# Patient Record
Sex: Female | Born: 1980 | Race: White | Hispanic: Yes | Marital: Single | Smoking: Never smoker
Health system: Southern US, Community
[De-identification: ages and names within clinical notes are randomized; demographics above are authoritative.]

## PROBLEM LIST (undated history)

## (undated) DIAGNOSIS — Z789 Other specified health status: Secondary | ICD-10-CM

## (undated) HISTORY — DX: Other specified health status: Z78.9

## (undated) HISTORY — PX: NO PAST SURGERIES: SHX2092

---

## 2008-07-18 ENCOUNTER — Inpatient Hospital Stay (HOSPITAL_COMMUNITY): Admission: AD | Admit: 2008-07-18 | Discharge: 2008-07-18 | Payer: Self-pay | Admitting: Obstetrics & Gynecology

## 2008-07-18 ENCOUNTER — Ambulatory Visit: Payer: Self-pay | Admitting: Advanced Practice Midwife

## 2008-09-05 ENCOUNTER — Ambulatory Visit (HOSPITAL_COMMUNITY): Admission: RE | Admit: 2008-09-05 | Discharge: 2008-09-05 | Payer: Self-pay | Admitting: Obstetrics & Gynecology

## 2008-09-27 ENCOUNTER — Ambulatory Visit (HOSPITAL_COMMUNITY): Admission: RE | Admit: 2008-09-27 | Discharge: 2008-09-27 | Payer: Self-pay | Admitting: Obstetrics & Gynecology

## 2008-10-11 ENCOUNTER — Ambulatory Visit (HOSPITAL_COMMUNITY): Admission: RE | Admit: 2008-10-11 | Discharge: 2008-10-11 | Payer: Self-pay | Admitting: Obstetrics & Gynecology

## 2008-11-17 ENCOUNTER — Ambulatory Visit: Payer: Self-pay | Admitting: Obstetrics & Gynecology

## 2008-11-21 ENCOUNTER — Inpatient Hospital Stay (HOSPITAL_COMMUNITY): Admission: RE | Admit: 2008-11-21 | Discharge: 2008-11-23 | Payer: Self-pay | Admitting: Obstetrics and Gynecology

## 2008-11-21 ENCOUNTER — Ambulatory Visit: Payer: Self-pay | Admitting: Family Medicine

## 2010-05-17 LAB — CBC
MCHC: 33.1 g/dL (ref 30.0–36.0)
Platelets: 205 10*3/uL (ref 150–400)
RDW: 14.2 % (ref 11.5–15.5)
RDW: 14.4 % (ref 11.5–15.5)

## 2010-05-17 LAB — RUBELLA SCREEN: Rubella: >500 IU/mL — ABNORMAL HIGH

## 2010-05-17 LAB — RPR: RPR Ser Ql: NONREACTIVE

## 2010-05-17 LAB — DIFFERENTIAL
Basophils Absolute: 0 10*3/uL (ref 0.0–0.1)
Basophils Relative: 0 % (ref 0–1)
Eosinophils Relative: 1 % (ref 0–5)
Lymphocytes Relative: 21 % (ref 12–46)
Neutro Abs: 5.7 10*3/uL (ref 1.7–7.7)

## 2010-05-17 LAB — ANA: Anti Nuclear Antibody(ANA): NEGATIVE

## 2010-05-17 LAB — HEPATITIS B SURFACE ANTIGEN: Hepatitis B Surface Ag: NEGATIVE

## 2010-05-17 LAB — TYPE AND SCREEN: ABO/RH(D): A POS

## 2010-05-21 LAB — URINALYSIS, ROUTINE W REFLEX MICROSCOPIC
Glucose, UA: NEGATIVE mg/dL
Ketones, ur: NEGATIVE mg/dL
Specific Gravity, Urine: 1.015 (ref 1.005–1.030)
pH: 7.5 (ref 5.0–8.0)

## 2010-05-21 LAB — WET PREP, GENITAL
Clue Cells Wet Prep HPF POC: NONE SEEN
Trich, Wet Prep: NONE SEEN

## 2018-08-29 ENCOUNTER — Inpatient Hospital Stay (HOSPITAL_COMMUNITY)
Admission: EM | Admit: 2018-08-29 | Discharge: 2018-08-29 | Disposition: A | Discharge status: Home / Self Care | Payer: Self-pay | Attending: Obstetrics and Gynecology | Admitting: Obstetrics and Gynecology

## 2018-08-29 ENCOUNTER — Inpatient Hospital Stay (HOSPITAL_COMMUNITY): Payer: Self-pay

## 2018-08-29 ENCOUNTER — Encounter (HOSPITAL_COMMUNITY): Payer: Self-pay | Admitting: *Deleted

## 2018-08-29 ENCOUNTER — Other Ambulatory Visit: Payer: Self-pay

## 2018-08-29 DIAGNOSIS — O26891 Other specified pregnancy related conditions, first trimester: Secondary | ICD-10-CM

## 2018-08-29 DIAGNOSIS — Z833 Family history of diabetes mellitus: Secondary | ICD-10-CM | POA: Insufficient documentation

## 2018-08-29 DIAGNOSIS — R102 Pelvic and perineal pain: Secondary | ICD-10-CM | POA: Insufficient documentation

## 2018-08-29 DIAGNOSIS — O9989 Other specified diseases and conditions complicating pregnancy, childbirth and the puerperium: Secondary | ICD-10-CM | POA: Insufficient documentation

## 2018-08-29 DIAGNOSIS — Z3A11 11 weeks gestation of pregnancy: Secondary | ICD-10-CM | POA: Insufficient documentation

## 2018-08-29 DIAGNOSIS — O208 Other hemorrhage in early pregnancy: Secondary | ICD-10-CM | POA: Insufficient documentation

## 2018-08-29 DIAGNOSIS — O418X10 Other specified disorders of amniotic fluid and membranes, first trimester, not applicable or unspecified: Secondary | ICD-10-CM

## 2018-08-29 DIAGNOSIS — R109 Unspecified abdominal pain: Secondary | ICD-10-CM

## 2018-08-29 LAB — CBC
HCT: 35.7 % — ABNORMAL LOW (ref 36.0–46.0)
Hemoglobin: 11.5 g/dL — ABNORMAL LOW (ref 12.0–15.0)
MCH: 26.2 pg (ref 26.0–34.0)
MCHC: 32.2 g/dL (ref 30.0–36.0)
MCV: 81.3 fL (ref 80.0–100.0)
Platelets: 285 10*3/uL (ref 150–400)
RBC: 4.39 MIL/uL (ref 3.87–5.11)
RDW: 14.1 % (ref 11.5–15.5)
WBC: 8.1 10*3/uL (ref 4.0–10.5)
nRBC: 0 % (ref 0.0–0.2)

## 2018-08-29 LAB — COMPREHENSIVE METABOLIC PANEL
ALT: 17 U/L (ref 0–44)
AST: 16 U/L (ref 15–41)
Albumin: 3.7 g/dL (ref 3.5–5.0)
Alkaline Phosphatase: 65 U/L (ref 38–126)
Anion gap: 9 (ref 5–15)
BUN: <5 mg/dL — ABNORMAL LOW (ref 6–20)
CO2: 22 mmol/L (ref 22–32)
Calcium: 9.3 mg/dL (ref 8.9–10.3)
Chloride: 107 mmol/L (ref 98–111)
Creatinine, Ser: 0.54 mg/dL (ref 0.44–1.00)
GFR calc Af Amer: >60 mL/min (ref >60)
GFR calc non Af Amer: >60 mL/min (ref >60)
Glucose, Bld: 90 mg/dL (ref 70–99)
Potassium: 3.9 mmol/L (ref 3.5–5.1)
Sodium: 138 mmol/L (ref 135–145)
Total Bilirubin: 0.7 mg/dL (ref 0.3–1.2)
Total Protein: 7.2 g/dL (ref 6.5–8.1)

## 2018-08-29 LAB — WET PREP, GENITAL
Clue Cells Wet Prep HPF POC: NONE SEEN
Sperm: NONE SEEN
Trich, Wet Prep: NONE SEEN
Yeast Wet Prep HPF POC: NONE SEEN

## 2018-08-29 LAB — URINALYSIS, ROUTINE W REFLEX MICROSCOPIC
Bilirubin Urine: NEGATIVE
Glucose, UA: NEGATIVE mg/dL
Hgb urine dipstick: NEGATIVE
Ketones, ur: NEGATIVE mg/dL
Leukocytes,Ua: NEGATIVE
Nitrite: NEGATIVE
Protein, ur: NEGATIVE mg/dL
Specific Gravity, Urine: 1.014 (ref 1.005–1.030)
pH: 8 (ref 5.0–8.0)

## 2018-08-29 LAB — ABO/RH: ABO/RH(D): A POS

## 2018-08-29 LAB — POCT PREGNANCY, URINE: Preg Test, Ur: POSITIVE — AB

## 2018-08-29 NOTE — MAU Note (Signed)
Urine in lab Not enough for culture tube 

## 2018-08-29 NOTE — MAU Note (Addendum)
Kristy Phillips is a 38 y.o. at [redacted]w[redacted]d here in MAU reporting:  +lower mid abdominal pain that radiates into her back Described as an intermittent "deep pain" Has not tried anything for it +vaginal discharge: white and yellow in color +vaginal bleeding: not having to wear a pad, notices when wipes, "dark black color"  LMP: 06/21/2018 +HPT 2 weeks ago  Onset of complaint: Thursday but has gotten worse Pain score: 8/10 Vitals:   08/29/18 1422 08/29/18 1600  BP: 136/83 129/71  Pulse: (!) 101 83  Resp: 16 18  Temp: 98.6 F (37 C) 98.6 F (37 C)  SpO2: 100% 100%      Lab orders placed from triage: ua and poc pregnancy test  In person interpreter used for triage

## 2018-08-29 NOTE — Discharge Instructions (Signed)
Hematoma subcoriónico °Subchorionic Hematoma ° °Un hematoma subcoriónico es una acumulación de sangre entre la pared externa del embrión (corion) y la pared interna de la matriz (útero). °Esta afección puede causar hemorragia vaginal. Si causan poca o nada de hemorragia vaginal, generalmente, los hematomas pequeños que ocurren al principio del embarazo se reducen por su propia cuenta y no afectan al bebé ni al embarazo. Cuando la hemorragia comienza más tarde en el embarazo, o el hematoma es más grande o se produce en una paciente de edad avanzada, la afección puede ser más grave. Los hematomas más grandes pueden agrandarse aún más, lo que aumenta las posibilidades de aborto espontáneo. Esta afección también aumenta los siguientes riesgos: °· Separación prematura de la placenta del útero. °· Parto antes de término (prematuro). °· Muerte fetal. °¿Cuáles son las causas? °Se desconoce la causa exacta de esta afección. Ocurre cuando la sangre queda atrapada entre la placenta y la pared uterina porque la placenta se ha separado del lugar original del implante. °¿Qué incrementa el riesgo? °Es más probable que desarrolle esta afección si: °· Recibió tratamiento con medicamentos para la fertilidad. °· La concepción se realizó a través de la fertilización in vitro (FIV). °¿Cuáles son los signos o los síntomas? °Los síntomas de esta afección incluyen los siguientes: °· Pérdida o hemorragia vaginal. °· Contracciones del útero. Estas contracciones provocan dolor abdominal. °En ocasiones, puede no haber síntomas y la hemorragia solo se puede ver cuando se toman imágenes ecográficas (ecografía transvaginal). °¿Cómo se diagnostica? °Esta afección se diagnostica con un examen físico. Es un examen pélvico. También pueden hacerle otros estudios, por ejemplo: °· Análisis de sangre. °· Análisis de orina. °· Ecografía del abdomen. °¿Cómo se trata? °El tratamiento de esta afección puede variar. El tratamiento puede incluir lo  siguiente: °· Observación cautelosa. La observarán atentamente para detectar cualquier cambio en la hemorragia. Durante esta etapa: °? El hematoma puede reabsorberse en el cuerpo. °? El hematoma puede separar el espacio lleno de líquido que contiene al embrión (saco gestacional) de la pared del útero (endometrio). °· Medicamentos. °· Restricción de las actividades. Puede ser necesaria hasta que se detenga la hemorragia. °Siga estas indicaciones en su casa: °· Haga reposo en cama si se lo indica el médico. °· No levante ningún objeto que pese más de 10 libras (4,5 kg) o siga las indicaciones del médico. °· No consuma ningún producto que contenga nicotina o tabaco, como cigarrillos y cigarrillos electrónicos. Si necesita ayuda para dejar de fumar, consulte al médico. °· Lleve un registro escrito de la cantidad de toallas higiénicas que utiliza cada día y cuán empapadas (saturadas) están. °· No use tampones. °· Concurra a todas las visitas de control como se lo haya indicado el médico. Esto es importante. El profesional podrá pedirle que se realice análisis de seguimiento, ecografías o ambas. °Comuníquese con un médico si: °· Tiene una hemorragia vaginal. °· Tiene fiebre. °Solicite ayuda de inmediato si: °· Siente calambres intensos en el estómago, en la espalda, en el abdomen o en la pelvis. °· Elimina coágulos o tejidos grandes. Guarde los tejidos para que su médico los vea. °· Tiene más hemorragia vaginal, y se desmaya o se siente mareada o débil. °Resumen °· Un hematoma subcoriónico es una acumulación de sangre entre la pared externa de la placenta y el útero. °· Esta afección puede causar hemorragia vaginal. °· En ocasiones, puede no haber síntomas y la hemorragia solo se puede ver cuando se toman imágenes ecográficas. °· El tratamiento puede incluir una observación cautelosa, medicamentos   o restriccin de las actividades. Esta informacin no tiene Marine scientist el consejo del mdico. Asegrese de hacerle  al mdico cualquier pregunta que tenga. Document Released: 05/16/2008 Document Revised: 11/07/2016 Document Reviewed: 11/07/2016 Elsevier Patient Education  2020 [employer].

## 2018-08-29 NOTE — ED Notes (Signed)
Report given to christina, RN in MAU, transport called to take patient over to MAU

## 2018-08-29 NOTE — MAU Provider Note (Addendum)
Patient Kristy Phillips is a 38 y.o. [id number] At [redacted]w[redacted]d here with complaints of abdominal pain for the past two days, as well as infrequent spotting. She reports that she has been seeing occasional small blood clots for the past few weeks but didn't think anything of it. She started having abdominal pain yesterday and decided to come in today to get checked out.   She denies dysuria, other abnormal discharge, fever, SOB, cough, chills, constipation, diarrhea or other ob-gyn complaint.  History     CSN: [id number]  Arrival date and time: 08/29/18 1410   None     Chief Complaint  Patient presents with  . Abdominal Pain  . Vaginal Bleeding  . Vaginal Discharge   Abdominal Pain This is a new problem. The current episode started in the past 7 days. The problem occurs intermittently. The problem has been gradually worsening. The pain is located in the suprapubic region. The pain is at a severity of 8/10. The quality of the pain is cramping. The abdominal pain does not radiate. Pertinent negatives include no constipation, diarrhea, frequency, nausea or vomiting.    OB History    Gravida  5   Para  4   Term  4   Preterm      AB      Living  4     SAB      TAB      Ectopic      Multiple      Live Births              History reviewed. No pertinent past medical history.  History reviewed. No pertinent surgical history.  Family History  Problem Relation Age of Onset  . Diabetes Mother   . Diabetes Maternal Aunt   . Diabetes Maternal Uncle   . Diabetes Maternal Grandmother     Social History   Tobacco Use  . Smoking status: Never Smoker  . Smokeless tobacco: Never Used  Substance Use Topics  . Alcohol use: Not Currently  . Drug use: Never    Allergies: No Known Allergies  No medications prior to admission.    Review of Systems  Constitutional: Negative.   HENT: Negative.   Respiratory: Negative.   Cardiovascular: Negative.   Gastrointestinal:  Positive for abdominal pain. Negative for constipation, diarrhea, nausea and vomiting.  Genitourinary: Negative.  Negative for frequency.  Musculoskeletal: Negative.   Neurological: Negative.   Hematological: Negative.   Psychiatric/Behavioral: Negative.    Physical Exam   Blood pressure 129/71, pulse 83, temperature 98.6 F (37 C), temperature source Oral, resp. rate 18, last menstrual period 06/21/2018, SpO2 100 %.  Physical Exam  Constitutional: She is oriented to person, place, and time. She appears well-developed and well-nourished.  HENT:  Head: Normocephalic.  Neck: Normal range of motion.  Respiratory: Effort normal.  GI: Soft.  Genitourinary:    Vagina normal.     Genitourinary Comments: NEFG; no blood or discharge in the vagina. Cervix is closed, non-tender, posterior. No CMT; slight suprapubic tenderness but no tenderness on palpation.    Musculoskeletal: Normal range of motion.  Neurological: She is alert and oriented to person, place, and time.  Skin: Skin is warm and dry.    MAU Course  Procedures  MDM -complete ectopic work-up done; [place], labs, physical exam.  -US shows Burnham with cardiac activty; dated at 35 w 5 days. Small subchorionic hemorrhage noted.  -wet prep normal -GC chlamydia:   -CMP, CBC normal.  Assessment and  Plan   1. Subchorionic hemorrhage of placenta in first trimester, single or unspecified fetus   2. Abdominal pain   3. Pelvic pain in pregnancy, antepartum, first trimester    2. Patient safe for discharge with bleeding recommendations and explanation of when to return to MAU.   3. [facility]   4. All questions answered.   Charlesetta GaribaldiKathryn Lorraine Kooistra 08/29/2018, 6:06 PM

## 2018-08-30 DIAGNOSIS — O468X1 Other antepartum hemorrhage, first trimester: Secondary | ICD-10-CM

## 2018-08-30 DIAGNOSIS — R109 Unspecified abdominal pain: Secondary | ICD-10-CM

## 2018-08-30 DIAGNOSIS — O418X10 Other specified disorders of amniotic fluid and membranes, first trimester, not applicable or unspecified: Secondary | ICD-10-CM

## 2018-08-30 DIAGNOSIS — R102 Pelvic and perineal pain: Secondary | ICD-10-CM

## 2018-08-30 DIAGNOSIS — Z3A11 11 weeks gestation of pregnancy: Secondary | ICD-10-CM

## 2018-08-30 DIAGNOSIS — O26891 Other specified pregnancy related conditions, first trimester: Secondary | ICD-10-CM

## 2018-08-30 LAB — BETA HCG QUANT (REF LAB): hCG Quant: 35962 m[IU]/mL

## 2018-09-02 LAB — GC/CHLAMYDIA PROBE AMP (~~LOC~~) NOT AT ARMC
Chlamydia: NEGATIVE
Neisseria Gonorrhea: NEGATIVE

## 2018-10-05 ENCOUNTER — Other Ambulatory Visit (HOSPITAL_COMMUNITY): Payer: Self-pay | Admitting: Family

## 2018-10-05 LAB — OB RESULTS CONSOLE RPR: RPR: NONREACTIVE

## 2018-10-05 LAB — OB RESULTS CONSOLE RUBELLA ANTIBODY, IGM: Rubella: IMMUNE

## 2018-10-05 LAB — OB RESULTS CONSOLE HIV ANTIBODY (ROUTINE TESTING): HIV: NONREACTIVE

## 2018-10-05 LAB — OB RESULTS CONSOLE GC/CHLAMYDIA: Gonorrhea: NEGATIVE

## 2018-10-05 LAB — OB RESULTS CONSOLE HEPATITIS B SURFACE ANTIGEN: Hepatitis B Surface Ag: NEGATIVE

## 2018-10-06 ENCOUNTER — Other Ambulatory Visit (HOSPITAL_COMMUNITY): Payer: Self-pay | Admitting: Family

## 2018-10-06 DIAGNOSIS — Z3A19 19 weeks gestation of pregnancy: Secondary | ICD-10-CM

## 2018-10-06 DIAGNOSIS — Z363 Encounter for antenatal screening for malformations: Secondary | ICD-10-CM

## 2018-10-06 DIAGNOSIS — O09522 Supervision of elderly multigravida, second trimester: Secondary | ICD-10-CM

## 2018-10-21 ENCOUNTER — Ambulatory Visit (HOSPITAL_COMMUNITY)
Admission: RE | Admit: 2018-10-21 | Discharge: 2018-10-21 | Disposition: A | Discharge status: Home / Self Care | Payer: Medicaid Other | Source: Ambulatory Visit | Attending: Obstetrics and Gynecology | Admitting: Obstetrics and Gynecology

## 2018-10-21 ENCOUNTER — Other Ambulatory Visit (HOSPITAL_COMMUNITY): Payer: Self-pay | Admitting: *Deleted

## 2018-10-21 ENCOUNTER — Encounter (HOSPITAL_COMMUNITY): Payer: Self-pay

## 2018-10-21 ENCOUNTER — Ambulatory Visit (HOSPITAL_BASED_OUTPATIENT_CLINIC_OR_DEPARTMENT_OTHER): Payer: Self-pay

## 2018-10-21 ENCOUNTER — Ambulatory Visit (HOSPITAL_COMMUNITY): Payer: Self-pay | Admitting: *Deleted

## 2018-10-21 ENCOUNTER — Ambulatory Visit (HOSPITAL_COMMUNITY): Payer: Self-pay | Admitting: Genetic Counselor

## 2018-10-21 ENCOUNTER — Other Ambulatory Visit: Payer: Self-pay

## 2018-10-21 VITALS — BP 110/66 | HR 81 | Temp 98.7°F | Ht 61.0 in

## 2018-10-21 DIAGNOSIS — O09522 Supervision of elderly multigravida, second trimester: Secondary | ICD-10-CM | POA: Insufficient documentation

## 2018-10-21 DIAGNOSIS — Z3A19 19 weeks gestation of pregnancy: Secondary | ICD-10-CM | POA: Diagnosis not present

## 2018-10-21 DIAGNOSIS — Z315 Encounter for genetic counseling: Secondary | ICD-10-CM

## 2018-10-21 DIAGNOSIS — Z363 Encounter for antenatal screening for malformations: Secondary | ICD-10-CM

## 2018-10-21 DIAGNOSIS — O09529 Supervision of elderly multigravida, unspecified trimester: Secondary | ICD-10-CM

## 2018-10-21 NOTE — Progress Notes (Signed)
10/21/2018  Kristy Phillips [birth year] MRN: [id number] DOV: 10/21/2018  Kristy Phillips presented to the [facility] for a genetics consultation regarding advanced maternal age. Kristy Phillips came to her appointment alone due to COVID-19 visitor restrictions. The session was facilitated by [employer] Spanish interpreter Lizette [phone].  Indication for genetic counseling - Advanced maternal age  Prenatal history  Kristy Phillips is a G5P4004, 38 y.o. female. Her current pregnancy has completed [redacted]w[redacted]d (Estimated Date of Delivery: 03/15/19).  Kristy Phillips denied exposure to environmental toxins or chemical agents. She denied the use of alcohol, tobacco or street drugs. She reported taking prenatal vitamins. She denied significant viral illnesses, fevers, and bleeding during the course of her pregnancy. Her medical and surgical histories were noncontributory.  Family History  A three generation pedigree was drafted and reviewed. The family history is remarkable for the following:  - Kristy Phillips's mother, her mother's siblings, and many of her maternal first cousins have diabetes. Kristy Phillips partner Kristy Phillips also has a brother and several maternal aunts with diabetes. We discussed that many times, diabetes is multifactorial in nature, occurring due to a combination of genetic, lifestyle, and environmental factors. Diabetes can appear to run in families; thus, there is a chance that the couple and their children could also develop diabetes.  Kristy Phillips also has a family history of cancer. His sister died of uterine cancer that was diagnosed at age 44. His father died of gastric cancer that was diagnosed in his 14s. Kristy Phillips also had a paternal aunt or uncle and both paternal grandparents that died of cancer; however, Kristy Phillips had limited information about the types of cancer and ages of diagnosis for these individuals. Though most cancers are thought to be sporadic or due to environmental factors,  some families appear to have a strong predisposition to cancers. When considering a family history of cancer, we look for common types of cancer in multiple family members occurring at younger than typical ages. We discussed the option of meeting with a cancer genetic counselor to discuss any possible screening or testing options available. If Kristy Phillips is concerned about the family history of cancer and would like to learn more about the family's chance for an inherited cancer syndrome, his doctor may refer him or his relatives to the [facility] [phone]).   The remaining family histories were reviewed and found to be noncontributory for birth defects, intellectual disability, recurrent pregnancy loss, and known genetic conditions. Kristy Phillips had limited information about her paternal family history; thus, risk assessment was limited.  The patient's ethnicity is [place] The father of the pregnancy's ethnicity is [place]. Ashkenazi Jewish ancestry and consanguinity were denied. Pedigree will be scanned under Media.  Discussion  KristyRecinoswas referred to genetic counseling for advanced maternal age, as she will be30years old at the time of delivery. At her age and during thesecondtrimester, there is approximately a 1 in 111(0.9%) chance of having a child with a chromosomal abnormality. Her age-related risk to have a child with Down syndrome specifically is 1 in237(0.4%)in the first trimester. We briefly discussed features of trisomy 26 (Down syndrome), trisomy 67, and trisomy 34. These conditions often are not inherited, but instead occurdueto an error in chromosomal division during the formation of sperm and egg cells.  Kristy Phillips previously had quad screening performed which was negative. We reviewed that the risk for her pregnancy to be affected by Down syndrome decreased from her 1 in 111 age-related risk to less than  1 in 10,000, and the risk for trisomy 18 decreased from  her 1 in 237 age-related risk to less than 1 in 10,000 based on the results of this screen. Additionally, quad screen results were negative for open neural tube defects (ONTD). While this screen significantly reduces the likelihood of the pregnancy being affected by trisomy 21, trisomy 3418, or ONTDs, it cannot be considered diagnostic.  A complete ultrasound was performed today prior to our visit. The ultrasound report will be sent under separate cover. There were no visualized fetal anomalies or markers suggestive of aneuploidy. However, fetal sex could not be identified on today's ultrasound.  We reviewed that noninvasive prenatal screening (NIPS) is another available screening option that could aid in determining fetal sex. Specifically, we discussed that NIPS analyzes cell free fetal DNA found in the maternal blood circulation during pregnancy. This test is not diagnostic for chromosome conditions, but can provide information regarding the presence or absence of extra fetal DNA for chromosomes 13, 18 and 21. Thus, it would not identify or rule out all fetal aneuploidy. The reported detection rate is greater than 99% for trisomy 21, greater than 98% for trisomy 18, and greater than 99% for trisomy 3213. The false positive rate is reported to be less than 0.1% for any of these conditions. Kristy Phillips indicated that she is not interested in undergoing NIPS at this time. She may consider NIPS if fetal sex still cannot be determined at her follow-up ultrasound in 4 weeks.  Per ACOG recommendation, carrier screening for hemoglobinopathies, cystic fibrosis (CF) and spinal muscular atrophy (SMA) was discussed including information about the conditions, rationale for testing, autosomal recessive inheritance, and the option of prenatal diagnosis. Without carrier screening to refine risk and based on ethnicity alone, Kristy Phillips's risk to be a carrier of CF is 1 in 45. Her risk to be a carrier of SMA is 1 in 117. Her  risk to be a carrier of hemoglobinopathies is 1 in 3017. Kristy Phillips declined carrier screening at this time. She was informed that select hemoglobinopathies and CF are included on [place] newborn screen, but that SMA currently is not included. We discussed the Early Check research study to add SMA to her baby's newborn screening panel. Kristy Phillips indicated that she was interested in pursuing this, so she was given written information in Spanish on how to enroll in the Early Check study.  Lastly, Kristy Phillips was counseled regarding diagnostic testing via amniocentesis. We discussed the technical aspects of the procedure and quoted up to a 1 in 500 (0.2%) risk for spontaneous pregnancy loss or other adverse pregnancy outcomes as a result of amniocentesis. Cultured cells from an amniocentesis sample allow for the visualization of a fetal karyotype, which can detect >99% of chromosomal aberrations as well as fetal sex. Chromosomal microarray can also be performed to identify smaller deletions or duplications of fetal chromosomal material. After careful consideration, Kristy Phillips declined amniocentesis at this time. She understands that amniocentesis is available at any point after 16 weeks of pregnancy and that she may opt to undergo the procedure at a later date should she change her mind.  Additional screening and diagnostic testing were declined today. She understands that screening tests, including ultrasound, cannot rule out all birth defects or genetic syndromes. The patient was advised of this limitation and states she still does not want additional testing or screening at this time.   I counseled Kristy Phillips regarding the above risks and available options. The  approximate face-to-face time with the genetic counselor was 45 minutes.  In summary:  Discussed risk for chromosomal conditions and options for follow-up testing  Previously had negative quad screening  Declined NIPS to assist in  determining fetal sex  Reviewed negative quad screen results  Reduction in risk for Down syndrome, trisomy 18, and ONTDs in current pregnancy  Discussed carrier screening for cystic fibrosis, and spinal muscular atrophy  Declined carrier screening  Reviewed results of ultrasound  No fetal anomalies or markers seen  Reduction in risk for fetal aneuploidy  Fetal sex could not be determined. Will have follow-up ultrasound in 4 weeks  Offered additional testing and screening  Declined amniocentesis  Reviewed family history concerns   Buelah Manis, MS Genetic Counselor

## 2018-11-18 ENCOUNTER — Ambulatory Visit (HOSPITAL_COMMUNITY): Payer: Self-pay | Admitting: *Deleted

## 2018-11-18 ENCOUNTER — Encounter (HOSPITAL_COMMUNITY): Payer: Self-pay

## 2018-11-18 ENCOUNTER — Ambulatory Visit (HOSPITAL_COMMUNITY)
Admission: RE | Admit: 2018-11-18 | Discharge: 2018-11-18 | Disposition: A | Discharge status: Home / Self Care | Payer: Self-pay | Source: Ambulatory Visit | Attending: Obstetrics and Gynecology | Admitting: Obstetrics and Gynecology

## 2018-11-18 ENCOUNTER — Other Ambulatory Visit: Payer: Self-pay

## 2018-11-18 VITALS — BP 117/70 | HR 89 | Temp 98.6°F

## 2018-11-18 DIAGNOSIS — O09522 Supervision of elderly multigravida, second trimester: Secondary | ICD-10-CM

## 2018-11-18 DIAGNOSIS — Z3A23 23 weeks gestation of pregnancy: Secondary | ICD-10-CM

## 2018-11-18 DIAGNOSIS — O09529 Supervision of elderly multigravida, unspecified trimester: Secondary | ICD-10-CM | POA: Insufficient documentation

## 2018-11-18 DIAGNOSIS — Z362 Encounter for other antenatal screening follow-up: Secondary | ICD-10-CM

## 2019-02-12 NOTE — L&D Delivery Note (Addendum)
OB/GYN Faculty Practice Delivery Note  Kristy Phillips is a 39 y.o. [id number] s/p SVD at [redacted]w[redacted]d. She was admitted for active labor.   ROM: 1h 27m with clear fluid GBS Status:  Positive  Labor Progress: . Patient presented to L&D for active labor. Initial SVE: 4. She then progressed to complete.   Delivery Date/Time: 03/07/2019 @ 1449 Delivery: Called to room and patient was complete and pushing. Head delivered without difficulty. Nuchal cord present x 1, unable to reduce, delivered through. Shoulder delivery with gentle downward traction and body delivered in usual fashion. Infant with spontaneous cry, placed on mother's abdomen, dried and stimulated. Cord clamped x 2 after 1-minute delay, and cut by FOB. Cord blood drawn. Placenta delivered spontaneously with gentle cord traction. Fundus firm with massage and Pitocin. Labia, perineum, vagina, and cervix inspected inspected, 1st degree perineal laceration present and repair with 3-0 vicryl.  Mother and baby stable and bonding.   Placenta: Sent to L&D Complications: None Lacerations: 1st degree, perineal  EBL: 50 Analgesia: none   Infant: APGAR (1 MIN): 8   APGAR (5 MINS): 9   APGAR (10 MINS):    Sherlyn Lees, SNM, RNC-OB

## 2019-02-18 LAB — OB RESULTS CONSOLE GBS
GBS: NEGATIVE
GBS: POSITIVE

## 2019-03-06 ENCOUNTER — Encounter (HOSPITAL_COMMUNITY): Payer: Self-pay | Admitting: Obstetrics and Gynecology

## 2019-03-06 ENCOUNTER — Other Ambulatory Visit: Payer: Self-pay

## 2019-03-06 ENCOUNTER — Inpatient Hospital Stay (HOSPITAL_COMMUNITY)
Admission: AD | Admit: 2019-03-06 | Discharge: 2019-03-06 | Disposition: A | Discharge status: Home / Self Care | Payer: Self-pay | Attending: Family Medicine | Admitting: Family Medicine

## 2019-03-06 DIAGNOSIS — O479 False labor, unspecified: Secondary | ICD-10-CM

## 2019-03-06 DIAGNOSIS — O4703 False labor before 37 completed weeks of gestation, third trimester: Secondary | ICD-10-CM

## 2019-03-06 DIAGNOSIS — O471 False labor at or after 37 completed weeks of gestation: Secondary | ICD-10-CM | POA: Insufficient documentation

## 2019-03-06 DIAGNOSIS — O09523 Supervision of elderly multigravida, third trimester: Secondary | ICD-10-CM | POA: Insufficient documentation

## 2019-03-06 DIAGNOSIS — Z3A38 38 weeks gestation of pregnancy: Secondary | ICD-10-CM | POA: Insufficient documentation

## 2019-03-06 NOTE — MAU Note (Signed)
Kristy Phillips is a 38 y.o. at [redacted]w[redacted]d here in MAU reporting:  +contractions with mucus like discharge Every 15 minutes Onset of complaint: last night Pain score: 6/10 Vitals:   03/06/19 1508  BP: 140/61  Pulse: 85  Resp: 16  Temp: 98 F (36.7 C)  SpO2: 99%     FHT: +FM; 150 via efm Lab orders placed from triage: mau labor triage  In person spanish language assistance

## 2019-03-06 NOTE — Discharge Instructions (Signed)
Contracciones de Braxton Hicks °Braxton Hicks Contractions °Las contracciones del útero pueden presentarse durante todo el embarazo, pero no siempre indican que la mujer está de parto. Es posible que usted haya tenido contracciones de práctica llamadas "contracciones de Braxton Hicks". A veces, se las confunde con el parto real. °¿Qué son las contracciones de Braxton Hicks? °Las contracciones de Braxton Hicks son espasmos que se producen en los músculos del útero antes del parto. A diferencia de las contracciones del parto verdadero, estas no producen el agrandamiento (la dilatación) ni el afinamiento del cuello uterino. Hacia el final del embarazo (entre las semanas 32 y 34), las contracciones de Braxton Hicks pueden presentarse más seguido y tornarse más intensas. A veces, resulta difícil distinguirlas del parto verdadero porque pueden ser muy molestas. No debe sentirse avergonzada si concurre al hospital con falso parto. °En ocasiones, la única forma de saber si el trabajo de parto es verdadero es que el médico determine si hay cambios en el cuello del útero. El médico le hará un examen físico y quizás le controle las contracciones. Si usted no está de parto verdadero, el examen debe indicar que el cuello uterino no está dilatado y que usted no ha roto bolsa. °Si no hay otros problemas de salud asociados con su embarazo, no habrá inconvenientes si la envían a su casa con un falso parto. Es posible que las contracciones de Braxton Hicks continúen hasta que se desencadene el parto verdadero. °Cómo diferenciar el trabajo de parto falso del verdadero °Trabajo de parto verdadero °· Las contracciones duran de 30 a 70 segundos. °· Las contracciones pueden tornarse muy regulares. °· La molestia generalmente se siente en la parte superior del útero y se extiende hacia la zona baja del abdomen y hacia la cintura. °· Las contracciones no desaparecen cuando usted camina. °· Las contracciones generalmente se hacen más  intensas y aumentan en frecuencia. °· El cuello uterino se dilata y se afina. °Parto falso °· En general, las contracciones son más cortas y no tan intensas como las del parto verdadero. °· En general, las contracciones son irregulares. °· A menudo, las contracciones se sienten en la parte delantera de la parte baja del abdomen y en la ingle. °· Las contracciones pueden desaparecer cuando usted camina o cambia de posición mientras está acostada. °· Las contracciones se vuelven más débiles y su duración es menor a medida que transcurre el tiempo. °· En general, el cuello uterino no se dilata ni se afina. °Siga estas indicaciones en su casa: ° °· Tome los medicamentos de venta libre y los recetados solamente como se lo haya indicado el médico. °· Continúe haciendo los ejercicios habituales y siga las demás indicaciones que el médico le dé. °· Coma y beba con moderación si cree que está de parto. °· Si las contracciones de Braxton Hicks le provocan incomodidad: °? Cambie de posición: si está acostada o descansando, camine; si está caminando, descanse. °? Siéntese y descanse en una bañera con agua tibia. °? Beba suficiente líquido como para mantener la orina de color amarillo pálido. La deshidratación puede provocar contracciones. °? Respire lenta y profundamente varias veces por hora. °· Vaya a todas las visitas de control prenatales y de control como se lo haya indicado el médico. Esto es importante. °Comuníquese con un médico si: °· Tiene fiebre. °· Siente dolor constante en el abdomen. °Solicite ayuda de inmediato si: °· Las contracciones se intensifican, se hacen más regulares y cercanas entre sí. °· Tiene una pérdida de líquido por la vagina. °· Elimina   una mucosidad sanguinolenta (pérdida del tapón mucoso). °· Tiene una hemorragia vaginal. °· Tiene un dolor en la zona lumbar que nunca tuvo antes. °· Siente que la cabeza del bebé empuja hacia abajo y ejerce presión en la zona pélvica. °· El bebé no se mueve tanto  como antes. °Resumen °· Las contracciones que se presentan antes del parto se conocen como contracciones de Braxton Hicks, falso parto o contracciones de práctica. °· En general, las contracciones de Braxton Hicks son más cortas, más débiles, con más tiempo entre una y otra, y menos regulares que las contracciones del parto verdadero. Las contracciones del parto verdadero se intensifican progresivamente y se tornan regulares y más frecuentes. °· Para controlar la molestia que producen las contracciones de Braxton Hicks, puede cambiar de posición, darse un baño templado y descansar, beber mucha agua o practicar la respiración profunda. °Esta información no tiene como fin reemplazar el consejo del médico. Asegúrese de hacerle al médico cualquier pregunta que tenga. °Document Revised: 05/09/2017 Document Reviewed: 09/09/2016 °Elsevier Patient Education © 2020 [employer] ° °

## 2019-03-06 NOTE — MAU Provider Note (Signed)
S: Kristy Phillips is a 39 y.o. S1N8871 at [redacted]w[redacted]d  who presents to MAU today complaining contractions q 15 minutes since 0800. She denies vaginal bleeding. She denies LOF. She reports normal fetal movement.    O: BP 122/75   Pulse 81   Temp 98 F (36.7 C) (Oral)   Resp 16   LMP 06/21/2018   SpO2 99% Comment: ra GENERAL: Well-developed, well-nourished female in no acute distress.  HEAD: Normocephalic, atraumatic.  CHEST: Normal effort of breathing, regular heart rate ABDOMEN: Soft, nontender, gravid  Cervical exam:  Dilation: 3.5 Effacement (%): 50 Cervical Position: Anterior Station: -3 Presentation: Vertex Exam by:: Janeth Rase RNC   Fetal Monitoring: Baseline: 145 Variability: moderate Accelerations: 15x15 Decelerations: none Contractions: occasional uc's   A: SIUP at [redacted]w[redacted]d  False labor  P: -Discharge home in stable condition -Labor precautions discussed -Patient advised to follow-up with [facility] as scheduled for prenatal care -Patient may return to MAU as needed or if her condition were to change or worsen   Rolm Bookbinder, [place] 03/06/2019 4:53 PM

## 2019-03-07 ENCOUNTER — Inpatient Hospital Stay (HOSPITAL_COMMUNITY)
Admission: AD | Admit: 2019-03-07 | Discharge: 2019-03-09 | DRG: 807 | Disposition: A | Discharge status: Home / Self Care | Payer: Medicaid Other | Attending: Family Medicine | Admitting: Family Medicine

## 2019-03-07 ENCOUNTER — Encounter (HOSPITAL_COMMUNITY): Payer: Self-pay | Admitting: Obstetrics & Gynecology

## 2019-03-07 DIAGNOSIS — Z20822 Contact with and (suspected) exposure to covid-19: Secondary | ICD-10-CM | POA: Diagnosis present

## 2019-03-07 DIAGNOSIS — O26893 Other specified pregnancy related conditions, third trimester: Secondary | ICD-10-CM | POA: Diagnosis present

## 2019-03-07 DIAGNOSIS — O99824 Streptococcus B carrier state complicating childbirth: Secondary | ICD-10-CM | POA: Diagnosis present

## 2019-03-07 DIAGNOSIS — O09529 Supervision of elderly multigravida, unspecified trimester: Secondary | ICD-10-CM

## 2019-03-07 DIAGNOSIS — Z3A38 38 weeks gestation of pregnancy: Secondary | ICD-10-CM

## 2019-03-07 LAB — TYPE AND SCREEN
ABO/RH(D): A POS
Antibody Screen: NEGATIVE

## 2019-03-07 LAB — CBC
HCT: 35.2 % — ABNORMAL LOW (ref 36.0–46.0)
Hemoglobin: 11.2 g/dL — ABNORMAL LOW (ref 12.0–15.0)
MCH: 26.4 pg (ref 26.0–34.0)
MCHC: 31.8 g/dL (ref 30.0–36.0)
MCV: 83 fL (ref 80.0–100.0)
Platelets: 220 10*3/uL (ref 150–400)
RBC: 4.24 MIL/uL (ref 3.87–5.11)
RDW: 15 % (ref 11.5–15.5)
WBC: 8.7 10*3/uL (ref 4.0–10.5)
nRBC: 0 % (ref 0.0–0.2)

## 2019-03-07 LAB — RPR: RPR Ser Ql: NONREACTIVE

## 2019-03-07 LAB — RESPIRATORY PANEL BY RT PCR (FLU A&B, COVID)
Influenza A by PCR: NEGATIVE
Influenza B by PCR: NEGATIVE
SARS Coronavirus 2 by RT PCR: NEGATIVE

## 2019-03-07 MED ORDER — LIDOCAINE HCL (PF) 1 % IJ SOLN
30.0000 mL | INTRAMUSCULAR | Status: AC | PRN
  Administered 2019-03-07: 30 mL via SUBCUTANEOUS
  Filled 2019-03-07: qty 30

## 2019-03-07 MED ORDER — COCONUT OIL OIL: 1.0000 "application " | TOPICAL_OIL | Status: DC | PRN

## 2019-03-07 MED ORDER — BENZOCAINE-MENTHOL 20-0.5 % EX AERO: 1.0000 "application " | INHALATION_SPRAY | CUTANEOUS | Status: DC | PRN

## 2019-03-07 MED ORDER — TETANUS-DIPHTH-ACELL PERTUSSIS 5-2.5-18.5 LF-MCG/0.5 IM SUSP: 0.5000 mL | Freq: Once | INTRAMUSCULAR | Status: DC

## 2019-03-07 MED ORDER — WITCH HAZEL-GLYCERIN EX PADS: 1.0000 "application " | MEDICATED_PAD | CUTANEOUS | Status: DC | PRN

## 2019-03-07 MED ORDER — SENNOSIDES-DOCUSATE SODIUM 8.6-50 MG PO TABS
2.0000 | ORAL_TABLET | ORAL | Status: DC
  Administered 2019-03-07 – 2019-03-09 (×2): 2 via ORAL
  Filled 2019-03-07 (×2): qty 2

## 2019-03-07 MED ORDER — LACTATED RINGERS IV SOLN: INTRAVENOUS | Status: DC

## 2019-03-07 MED ORDER — PRENATAL MULTIVITAMIN CH
1.0000 | ORAL_TABLET | Freq: Every day | ORAL | Status: DC
  Administered 2019-03-08 – 2019-03-09 (×2): 1 via ORAL
  Filled 2019-03-07 (×2): qty 1

## 2019-03-07 MED ORDER — FENTANYL CITRATE (PF) 100 MCG/2ML IJ SOLN
100.0000 ug | INTRAMUSCULAR | Status: DC | PRN
  Administered 2019-03-07 (×3): 100 ug via INTRAVENOUS
  Filled 2019-03-07 (×3): qty 2

## 2019-03-07 MED ORDER — OXYTOCIN 40 UNITS IN NORMAL SALINE INFUSION - SIMPLE MED
2.5000 [IU]/h | INTRAVENOUS | Status: DC
  Filled 2019-03-07: qty 1000

## 2019-03-07 MED ORDER — ONDANSETRON HCL 4 MG PO TABS: 4.0000 mg | ORAL_TABLET | ORAL | Status: DC | PRN

## 2019-03-07 MED ORDER — PENICILLIN G POT IN DEXTROSE 60000 UNIT/ML IV SOLN
3.0000 10*6.[IU] | Freq: Once | INTRAVENOUS | Status: AC
  Administered 2019-03-07: 3 10*6.[IU] via INTRAVENOUS
  Filled 2019-03-07: qty 50

## 2019-03-07 MED ORDER — ONDANSETRON HCL 4 MG/2ML IJ SOLN: 4.0000 mg | Freq: Four times a day (QID) | INTRAMUSCULAR | Status: DC | PRN

## 2019-03-07 MED ORDER — DIBUCAINE (PERIANAL) 1 % EX OINT: 1.0000 "application " | TOPICAL_OINTMENT | CUTANEOUS | Status: DC | PRN

## 2019-03-07 MED ORDER — ACETAMINOPHEN 325 MG PO TABS: 650.0000 mg | ORAL_TABLET | ORAL | Status: DC | PRN

## 2019-03-07 MED ORDER — OXYTOCIN BOLUS FROM INFUSION
500.0000 mL | Freq: Once | INTRAVENOUS | Status: AC
  Administered 2019-03-07: 15:00:00 500 mL via INTRAVENOUS

## 2019-03-07 MED ORDER — ONDANSETRON HCL 4 MG/2ML IJ SOLN: 4.0000 mg | INTRAMUSCULAR | Status: DC | PRN

## 2019-03-07 MED ORDER — IBUPROFEN 600 MG PO TABS
600.0000 mg | ORAL_TABLET | Freq: Four times a day (QID) | ORAL | Status: DC
  Administered 2019-03-07 – 2019-03-09 (×7): 600 mg via ORAL
  Filled 2019-03-07 (×7): qty 1

## 2019-03-07 MED ORDER — OXYCODONE-ACETAMINOPHEN 5-325 MG PO TABS
1.0000 | ORAL_TABLET | ORAL | Status: DC | PRN
  Administered 2019-03-07: 1 via ORAL
  Filled 2019-03-07: qty 1

## 2019-03-07 MED ORDER — SIMETHICONE 80 MG PO CHEW: 80.0000 mg | CHEWABLE_TABLET | ORAL | Status: DC | PRN

## 2019-03-07 MED ORDER — LACTATED RINGERS IV SOLN: 500.0000 mL | INTRAVENOUS | Status: DC | PRN

## 2019-03-07 MED ORDER — PENICILLIN G POT IN DEXTROSE 60000 UNIT/ML IV SOLN: 3.0000 10*6.[IU] | INTRAVENOUS | Status: DC

## 2019-03-07 MED ORDER — DIPHENHYDRAMINE HCL 25 MG PO CAPS: 25.0000 mg | ORAL_CAPSULE | Freq: Four times a day (QID) | ORAL | Status: DC | PRN

## 2019-03-07 MED ORDER — SOD CITRATE-CITRIC ACID 500-334 MG/5ML PO SOLN: 30.0000 mL | ORAL | Status: DC | PRN

## 2019-03-07 MED ORDER — SODIUM CHLORIDE 0.9 % IV SOLN
2.0000 g | Freq: Once | INTRAVENOUS | Status: AC
  Administered 2019-03-07: 2 g via INTRAVENOUS
  Filled 2019-03-07: qty 2000

## 2019-03-07 NOTE — Progress Notes (Signed)
RN educated MOB and FOB on safe sleep, medications, and fall education by interpreter: Chrissie Noa [phone].   Both verbalized understanding of information.

## 2019-03-07 NOTE — MAU Note (Signed)
Pt here with bright red vaginal bleeding. Pt reports contractions every 10 minutes. Denies LOF. Good fetal movement. Pt was seen in MAU yesterday for contractions. Cervix was 3.5cm

## 2019-03-07 NOTE — Lactation Note (Signed)
This note was copied from a baby's chart. Lactation Consultation Note  Patient Name: Kristy Phillips [place] [id number] Date: 03/07/2019 Reason for consult: Initial assessment;Early term 37-38.6wks  P5 mother whose infant is now 40 hours old.  This is an ETI at 38+6 weeks.  Mother breast fed her other four children for one year each.  Mother's feeding preference is breast/bottle and has supplemented all of her children with formula.  In house Spanish interpreter used for interpretation.  Baby was asleep in mother's arms when I arrived.  She had no questions/concerns related to breast feeding.  She was familiar with hand expression and feeding cues.  Encouraged to feed 8-12 times/24 hours or sooner if baby shows feeding cues.  Colostrum container provided and milk storage times discussed.  Finger feeding demonstrated.  Mom made aware of O/P services, breastfeeding support groups, community resources, and our phone # for post-discharge questions. Brochure provided in [place].  Mother does not have a DEBP for home use.  She has not ever needed one with her other children and desires her manual pump only.  She does not work outside the home.  Mother is a [facility] participant in [place].  Suggested she call for latch assistance or for any questions/concerns.  Father present.   Maternal Data Formula Feeding for Exclusion: Yes Reason for exclusion: Mother's choice to formula and breast feed on admission Has patient been taught Hand Expression?: Yes Does the patient have breastfeeding experience prior to this delivery?: Yes  Feeding Feeding Type: Breast Fed  LATCH Score Latch: Repeated attempts needed to sustain latch, nipple held in mouth throughout feeding, stimulation needed to elicit sucking reflex.  Audible Swallowing: A few with stimulation  Type of Nipple: Everted at rest and after stimulation  Comfort (Breast/Nipple): Soft / non-tender  Hold (Positioning): No assistance needed to  correctly position infant at breast.  LATCH Score: 8  Interventions    Lactation Tools Discussed/Used WIC Program: Yes   Consult Status Consult Status: Follow-up Date: 03/08/19 Follow-up type: In-patient    Dora Sims 03/07/2019, 8:12 PM

## 2019-03-07 NOTE — H&P (Signed)
OBSTETRIC ADMISSION HISTORY AND PHYSICAL  Barbera Perritt is a 39 y.o. female D3O6712 with IUP at [redacted]w[redacted]d by 11 wk [place] presenting for SOL; has had intermittent ctx for past 12 hours. Bloody show on admission. She reports +FMs, No LOF, no blurry vision, headaches or peripheral edema, and RUQ pain.  She plans on breast and bottle feeding. She requests Depo for birth control. 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 Medical history non-contributory     Past Surgical History: Past Surgical History:  Procedure Laterality Date  . NO PAST SURGERIES      Obstetrical History: OB History    Gravida  5   Para  4   Term  4   Preterm      AB      Living  4     SAB      TAB      Ectopic      Multiple      Live Births              Social History: Social History   Socioeconomic History  . Marital status: Single    Spouse name: Not on file  . Number of children: Not on file  . Years of education: Not on file  . Highest education level: Not on file  Occupational History  . Not on file  Tobacco Use  . Smoking status: Never Smoker  . Smokeless tobacco: Never Used  Substance and Sexual Activity  . Alcohol use: Not Currently  . Drug use: Never  . Sexual activity: Yes  Other Topics Concern  . Not on file  Social History Narrative  . Not on file   Social Determinants of Health   Financial Resource Strain:   . Difficulty of Paying Living Expenses: Not on file  Food Insecurity:   . Worried About Charity fundraiser in the Last Year: Not on file  . Ran Out of Food in the Last Year: Not on file  Transportation Needs:   . Lack of Transportation (Medical): Not on  file  . Lack of Transportation (Non-Medical): Not on file  Physical Activity:   . Days of Exercise per Week: Not on file  . Minutes of Exercise per Session: Not on file  Stress:   . Feeling of Stress : Not on file  Social Connections:   . Frequency of Communication with Friends and Family: Not on file  . Frequency of Social Gatherings with Friends and Family: Not on file  . Attends Religious Services: Not on file  . Active Member of Clubs or Organizations: Not on file  . Attends Archivist Meetings: Not on file  . Marital Status: Not on file    Family History: Family History  Problem Relation Age of Onset  . Diabetes Mother   . Diabetes Maternal Aunt   . Diabetes Maternal Uncle   . Diabetes Maternal Grandmother     Allergies: No Known Allergies  Medications Prior to Admission  Medication Sig Dispense Refill Last Dose  . Acetaminophen (TYLENOL PO) Take by mouth.     . Prenatal Vit-Fe Fumarate-FA (PRENATAL VITAMIN PO) Take by mouth.  Review of Systems   All systems reviewed and negative except as stated in HPI  Blood pressure (!) 143/91, pulse 98, temperature 99 F (37.2 C), resp. rate 18, last menstrual period 06/21/2018, SpO2 100 %. General appearance: alert, cooperative and appears stated age Lungs: normal effort Heart: regular rate  Abdomen: soft, non-tender; bowel sounds normal Pelvic: gravid uterus Extremities: Homans sign is negative, no sign of DVT Presentation: cephalic by RN exam Fetal monitoringBaseline: 150 bpm, Variability: Good {> 6 bpm), Accelerations: Reactive and Decelerations: Absent Uterine activity: Frequency: Every 5 minutes Dilation: 5.5 Effacement (%): 90 Station: -2 Exam by:: Camelia Eng, RN   Prenatal labs: ABO, Rh: --/--/PENDING (01/24 3299) Antibody: PENDING (01/24 0557) Rubella:   RPR:    HBsAg:    HIV:    GBS:    1 hr Glucola 123 Genetic screening  Quad negative Anatomy US with possible ambiguous  genitalia, female suspected on 23 wk [place]  Prenatal Transfer Tool  Maternal Diabetes: No Genetic Screening: Normal Maternal Ultrasounds/Referrals: As above; ambiguous genitalia on anatomy scan but likely female on 23wk [place] scan otherwise WNL Fetal Ultrasounds or other Referrals:  None Maternal Substance Abuse:  No Significant Maternal Medications:  None Significant Maternal Lab Results: Group B Strep positive  Results for orders placed or performed during the hospital encounter of 03/07/19 (from the past 24 hour(s))  Type and screen [facility]   Collection Time: 03/07/19  5:57 AM  Result Value Ref Range   ABO/RH(D) PENDING    Antibody Screen PENDING    Sample Expiration      03/10/2019,2359 Performed at [facility], [address] [place], [place] [address]     Patient Active Problem List   Diagnosis Date Noted  . Labor and delivery, indication for care 03/07/2019  . AMA (advanced maternal age) multigravida 35+ 03/07/2019    Assessment/Plan:  Joanie Duprey is a 39 y.o. T4H9622 at 102w6d here for SOL.  #Labor:Vertex by RN exam. No augmentation unless fetal/maternal indication in effort to complete GBS ppx. Anticipate SVD. #Pain: Per patient request #FWB: Cat I; EFW: 3500g #ID:  GBS pos; Amp then PCN if still pregnant after completes Amp #MOF: Both #MOC: Depo then possibly copper IUD at HD (would like BTL if has to have CS) #Circ:  declines  Jerilynn Birkenhead, MD [facility] Fellow, [facility] for [facility], [facility] 03/07/2019, 7:04 AM

## 2019-03-07 NOTE — Discharge Summary (Signed)
Postpartum Discharge Summary     Patient Name: Kristy Phillips DOB: 1980/11/08 MRN: [id number]  Date of admission: 03/07/2019 Delivering Provider: Laury Deep , CNM & Leeroy Cha, SNM  Date of discharge: 03/08/2019  Admitting diagnosis: Labor and delivery, indication for care [O75.9] Intrauterine pregnancy: [redacted]w[redacted]d    Secondary diagnosis:  Active Problems:   Labor and delivery, indication for care   AMA (advanced maternal age) multigravida 35+  Additional problems: none     Discharge diagnosis: Term Pregnancy Delivered                                                                                                Post partum procedures:NA  Augmentation: none  Complications: None  Hospital course:  Onset of Labor With Vaginal Delivery     39y.o. yo G5P5005 at 339w6das admitted in Active Labor on 03/07/2019. Patient had an uncomplicated labor course as follows: Initial SVE 4/70/-2. Received AROM. Progressed to complete.  Membrane Rupture Time/Date: 1:06 PM ,03/07/2019   Intrapartum Procedures: Episiotomy: None [1]                                         Lacerations:  1st degree [2];Perineal [11]  Patient had a delivery of a Viable infant. 03/07/2019  Information for the patient's newborn:  [name], [name][[id number]]Delivery Method: Vag-Spont     Pateint had an uncomplicated postpartum course. Declined Depo and would like IUD at HD. She is ambulating, tolerating a regular diet, passing flatus, and urinating well. Patient is discharged home in stable condition on 03/08/19.  Delivery time: 2:49 PM    Magnesium Sulfate received: No BMZ received: No Rhophylac:No MMR:No Transfusion:No  Physical exam  Vitals:   03/07/19 1830 03/07/19 2152 03/08/19 0109 03/08/19 0447  BP: 116/60 (!) 92/49 96/62 109/60  Pulse: 79 93 74 88  Resp: '18 18 16 18  ' Temp: 99.1 F (37.3 C) 98.7 F (37.1 C) 98.4 F (36.9 C) 98.2 F (36.8 C)  TempSrc: Oral Oral Oral Oral  SpO2: 98%  100% 100% 100%  Weight:      Height:       General: alert Lochia: appropriate Uterine Fundus: firm Incision: N/A DVT Evaluation: No evidence of DVT seen on physical exam. Labs: Lab Results  Component Value Date   WBC 8.7 03/07/2019   HGB 11.2 (L) 03/07/2019   HCT 35.2 (L) 03/07/2019   MCV 83.0 03/07/2019   PLT 220 03/07/2019   CMP Latest Ref Rng & Units 08/29/2018  Glucose 70 - 99 mg/dL 90  BUN 6 - 20 mg/dL <5(L)  Creatinine 0.44 - 1.00 mg/dL 0.54  Sodium 135 - 145 mmol/L 138  Potassium 3.5 - 5.1 mmol/L 3.9  Chloride 98 - 111 mmol/L 107  CO2 22 - 32 mmol/L 22  Calcium 8.9 - 10.3 mg/dL 9.3  Total Protein 6.5 - 8.1 g/dL 7.2  Total Bilirubin 0.3 - 1.2 mg/dL 0.7  Alkaline Phos 38 - 126 U/L 65  AST  15 - 41 U/L 16  ALT 0 - 44 U/L 17    Discharge instruction: per After Visit Summary and "Baby and Me Booklet".  After visit meds:  Allergies as of 03/08/2019   No Known Allergies     Medication List    TAKE these medications   acetaminophen 325 MG tablet Commonly known as: Tylenol Take 2 tablets (650 mg total) by mouth every 6 (six) hours as needed (for pain scale < 4). What changed:   medication strength  how much to take  when to take this  reasons to take this   ibuprofen 600 MG tablet Commonly known as: ADVIL Take 1 tablet (600 mg total) by mouth every 6 (six) hours.   PRENATAL VITAMIN PO Take by mouth.       Diet: routine diet  Activity: Advance as tolerated. Pelvic rest for 6 weeks.   Outpatient follow up:4 weeks Follow up Appt:No future appointments. Follow up Visit:   [facility] Patient - no PP Msg required  Newborn Data: Live born female  Birth Weight: 8 lb 2.3 oz (3695 g) APGAR: 8, 9  Newborn Delivery   Birth date/time: 03/07/2019 14:49:00 Delivery type: Vaginal, Spontaneous      Baby Feeding: Breast Disposition:home with mother   03/08/2019 Chauncey Mann, MD

## 2019-03-08 MED ORDER — ACETAMINOPHEN 325 MG PO TABS: 650.0000 mg | ORAL_TABLET | Freq: Four times a day (QID) | ORAL | 0 refills | Status: DC | PRN

## 2019-03-08 MED ORDER — IBUPROFEN 600 MG PO TABS: 600.0000 mg | ORAL_TABLET | Freq: Four times a day (QID) | ORAL | 0 refills | Status: DC

## 2019-03-08 NOTE — Progress Notes (Addendum)
POSTPARTUM PROGRESS NOTE  Post Partum Day 1  Subjective:  Sunshine Mackowski is a 39 y.o. [id number] s/p SVD at [redacted]w[redacted]d.  She reports she is doing well. No acute events overnight. She denies any problems with ambulating, voiding or po intake. Denies nausea or vomiting.  Pain is well controlled.  Lochia is appropriate.  Objective: Blood pressure 109/60, pulse 88, temperature 98.2 F (36.8 C), temperature source Oral, resp. rate 18, height 5\' 1"  (1.549 m), weight 82.6 kg, last menstrual period 06/21/2018, SpO2 100 %, unknown if currently breastfeeding.  Physical Exam:  General: alert, cooperative and no distress Chest: no respiratory distress Heart:regular rate, distal pulses intact Abdomen: soft, nontender,  Uterine Fundus: firm, appropriately tender DVT Evaluation: No calf swelling or tenderness Extremities: no edema Skin: warm, dry  Recent Labs    03/07/19 0752  HGB 11.2*  HCT 35.2*    Assessment/Plan: Shaunna Rosetti is a 39 y.o. 31 s/p SVD at [redacted]w[redacted]d   PPD#1 - Doing well. Continue routine postpartum care.  Contraception: Undecided Feeding: Both Dispo: Plan for discharge today.   LOS: 1 day    [redacted]w[redacted]d, DO, PGY-1 OBGYN Faculty Teaching Service  03/08/2019, 6:24 AM   I saw and evaluated the patient. I agree with the findings and the plan of care as documented in the resident's note. Plan for DC today.   03/10/2019, MD [facility] Family Medicine Fellow, [facility] for [facility]

## 2019-03-08 NOTE — Progress Notes (Addendum)
Spoke to "Lyn" at the Health Information Management (Medical Records) Office [phone] about Prenatal Labs being scanned in from the incorrect patient.  She requested pt MRN and stated she would give the information to the "scanner" to take care of the problem.  The scanned document belongs to a [facility] patient and can be seen under "Chart Review, then Media; Dated 02/23/2019 @1324  and titled "OB Prenatal"."

## 2019-03-08 NOTE — Lactation Note (Signed)
This note was copied from a baby's chart. Lactation Consultation Note  Patient Name: Kristy Phillips RWPTY'Y Date: 03/08/2019   Kristy Phillips, in-house interpreter, was present for the consult. Mom is a P5 & after Kristy Phillips introduced me as a Advertising copywriter, I asked Mom if she had any questions for me about feeding her baby. She smiled & said she did not.    Lurline Hare [facility] 03/08/2019, 11:49 AM

## 2019-03-09 MED ORDER — POLYETHYLENE GLYCOL 3350 17 GM/SCOOP PO POWD: 1.0000 | Freq: Once | ORAL | 0 refills | Status: AC

## 2019-03-09 NOTE — Discharge Summary (Signed)
Postpartum Discharge Summary    Patient Name: Kristy Phillips DOB: [birth year] MRN: [id number]  Date of admission: 03/07/2019 Delivering Provider: Laury Deep   Date of discharge: 03/09/2019  Admitting diagnosis: Labor and delivery, indication for care [O75.9] Intrauterine pregnancy: [redacted]w[redacted]d    Secondary diagnosis:  Active Problems:   Labor and delivery, indication for care   AMA (advanced maternal age) multigravida 35+  Additional problems:      Discharge diagnosis: Term Pregnancy Delivered                                                                                                Post partum procedures:none  Augmentation: none  Complications: None  Hospital course:  Onset of Labor With Vaginal Delivery     39y.o. yo G5P5005 at 35w6das admitted in Active Labor on 03/07/2019. Patient had an uncomplicated labor course as follows: Initial SVE 4/70/-2. Received AROM. Progressed to complete. Membrane Rupture Time/Date: 1:06 PM ,03/07/2019   Intrapartum Procedures: Episiotomy: None [1]                                         Lacerations:  1st degree [2];Perineal [11]  Patient had a delivery of a Viable infant. 03/07/2019  Information for the patient's newborn:  ReTrellis, Guirguis0[[id number]]Delivery Method: Vag-Spont     Patient had an uncomplicated postpartum course. Declined Depo and would like IUD at HD. She is ambulating, tolerating a regular diet, passing flatus, and urinating well. Patient is discharged home in stable condition on 03/09/19.  Delivery time: 2:49 PM    Magnesium Sulfate received: No BMZ received: No Rhophylac:No MMR:No Transfusion:No  Physical exam  Vitals:   03/08/19 0447 03/08/19 1420 03/08/19 2100 03/09/19 0548  BP: 109/60 (!) 99/56 105/63 107/61  Pulse: 88 77 67 79  Resp: '18 17 18 18  ' Temp: 98.2 F (36.8 C) 99 F (37.2 C) 98.7 F (37.1 C) 98 F (36.7 C)  TempSrc: Oral Oral Oral Oral  SpO2: 100% 100% 100% 99%  Weight:      Height:        General: alert, cooperative and no distress Lochia: appropriate Uterine Fundus: firm Incision: N/A DVT Evaluation: No evidence of DVT seen on physical exam. No significant calf/ankle edema. Labs: Lab Results  Component Value Date   WBC 8.7 03/07/2019   HGB 11.2 (L) 03/07/2019   HCT 35.2 (L) 03/07/2019   MCV 83.0 03/07/2019   PLT 220 03/07/2019   CMP Latest Ref Rng & Units 08/29/2018  Glucose 70 - 99 mg/dL 90  BUN 6 - 20 mg/dL <5(L)  Creatinine 0.44 - 1.00 mg/dL 0.54  Sodium 135 - 145 mmol/L 138  Potassium 3.5 - 5.1 mmol/L 3.9  Chloride 98 - 111 mmol/L 107  CO2 22 - 32 mmol/L 22  Calcium 8.9 - 10.3 mg/dL 9.3  Total Protein 6.5 - 8.1 g/dL 7.2  Total Bilirubin 0.3 - 1.2 mg/dL 0.7  Alkaline Phos 38 - 126 U/L 65  AST 15 -  41 U/L 16  ALT 0 - 44 U/L 17    Discharge instruction: per After Visit Summary and "Baby and Me Booklet".  After visit meds:  Allergies as of 03/09/2019   No Known Allergies     Medication List    TAKE these medications   acetaminophen 325 MG tablet Commonly known as: Tylenol Take 2 tablets (650 mg total) by mouth every 6 (six) hours as needed (for pain scale < 4). What changed:   medication strength  how much to take  when to take this  reasons to take this   ibuprofen 600 MG tablet Commonly known as: ADVIL Take 1 tablet (600 mg total) by mouth every 6 (six) hours.   polyethylene glycol powder 17 GM/SCOOP powder Commonly known as: GLYCOLAX/MIRALAX Take 255 g by mouth once for 1 dose.   PRENATAL VITAMIN PO Take by mouth.       Diet: routine diet  Activity: Advance as tolerated. Pelvic rest for 6 weeks.   Outpatient follow up:6 weeks Follow up Appt:No future appointments. Follow up Visit:    Please schedule this patient for Postpartum visit in: 6 weeks with the following provider: Any provider In-Person For C/S patients schedule nurse incision check in weeks 2 weeks: no Low risk pregnancy complicated by: n/a Delivery  mode:  SVD Anticipated Birth Control:  IUD PP Procedures needed: IUD insertion  Schedule Integrated [place] visit: no     Newborn Data: Live born female  Birth Weight: 8 lb 2.3 oz (3695 g) APGAR: 90, 9  Newborn Delivery   Birth date/time: 03/07/2019 14:49:00 Delivery type: Vaginal, Spontaneous      Baby Feeding: Breast Disposition:home with mother   03/09/2019 Clarnce Flock, MD

## 2019-03-09 NOTE — Discharge Instructions (Signed)
Parto vaginal, cuidados de puerperio Postpartum Care After Vaginal Delivery Lea esta informacin sobre cmo cuidarse desde el momento en que nazca su beb y hasta 6 a 12 semanas despus del parto (perodo del posparto). El mdico tambin podr darle instrucciones ms especficas. Comunquese con su mdico si tiene problemas o preguntas. Siga estas indicaciones en su casa: Hemorragia vaginal  Es normal tener un poco de hemorragia vaginal (loquios) despus del parto. Use un apsito sanitario para el sangrado vaginal y secrecin. ? Durante la primera semana despus del parto, la cantidad y el aspecto de los loquios a menudo es similar a las del perodo menstrual. ? Durante las siguientes semanas disminuir gradualmente hasta convertirse en una secrecin seca amarronada o amarillenta. ? En la mayora de las mujeres, los loquios se detienen completamente entre 4 a 6semanas despus del parto. Los sangrados vaginales pueden variar de mujer a mujer.  Cambie los apsitos sanitarios con frecuencia. Observe si hay cambios en el flujo, como: ? Un aumento repentino en el volumen. ? Cambio en el color. ? Cogulos sanguneos grandes.  Si expulsa un cogulo de sangre por la vagina, gurdelo y llame al mdico para informrselo. No deseche los cogulos de sangre por el inodoro antes de hablar con su mdico.  No use tampones ni se haga duchas vaginales hasta que el mdico la autorice.  Si no est amamantando, volver a tener su perodo entre 6 y 8 semanas despus del parto. Si solamente alimenta al beb con leche materna (lactancia materna exclusiva), podra no volver a tener su perodo hasta que deje de amamantar. Cuidados perineales  Mantenga la zona entre la vagina y el ano (perineo) limpia y seca, como se lo haya indicado el mdico. Utilice apsitos o aerosoles analgsicos y cremas, como se lo hayan indicado.  Si le hicieron un corte en el perineo (episiotoma) o tuvo un desgarro en la vagina, controle la  zona para detectar signos de infeccin hasta que sane. Est atenta a los siguientes signos: ? Aumento del enrojecimiento, la hinchazn o el dolor. ? Presenta lquido o sangre que supura del corte o desgarro. ? Calor. ? Pus o mal olor.  Es posible que le den una botella rociadora para que use en lugar de limpiarse el rea con papel higinico despus de usar el bao. Cuando comience a sanar, podr usar la botella rociadora antes de secarse. Asegrese de secarse suavemente.  Para aliviar el dolor causado por una episiotoma, un desgarro en la vagina o venas hinchadas en el ano (hemorroides), trate de tomar un bao de asiento tibio 2 o 3 veces por da. Un bao de asiento es un bao de agua tibia que se toma mientras se est sentado. El agua solo debe llegar hasta las caderas y cubrir las nalgas. Cuidado de las mamas  En los primeros das despus del parto, las mamas pueden sentirse pesadas, llenas e incmodas (congestin mamaria). Tambin puede escaparse leche de sus senos. El mdico puede sugerirle mtodos para aliviar este malestar. La congestin mamaria debera desaparecer al cabo de unos das.  Si est amamantando: ? Use un sostn que sujete y ajuste bien sus pechos. ? Mantenga los pezones secos y limpios. Aplquese cremas y ungentos, como se lo haya indicado el mdico. ? Es posible que deba usar discos de algodn en el sostn para absorber la leche que se filtre de sus senos. ? Puede tener contracciones uterinas cada vez que amamante durante varias semanas despus del parto. Las contracciones uterinas ayudan al tero a   regresar a su tamao habitual. ? Si tiene algn problema con la lactancia materna, colabore con el mdico o un asesor en lactancia.  Si no est amamantando: ? Evite tocarse mucho las mamas. Al hacerlo, podran producir ms leche. ? Use un sostn que le proporcione el ajuste correcto y compresas fras para reducir la hinchazn. ? No extraiga (saque) leche materna. Esto har que  produzca ms leche. Intimidad y sexualidad  Pregntele al mdico cundo puede retomar la actividad sexual. Esto puede depender de lo siguiente: ? Su riesgo de sufrir infecciones. ? La rapidez con la que est sanando. ? Su comodidad y deseo de retomar la actividad sexual.  Despus del parto, puede quedar embarazada incluso si no ha tenido todava su perodo. Si lo desea, hable con el mdico acerca de los mtodos de control de la natalidad (mtodos anticonceptivos). Medicamentos  Tome los medicamentos de venta libre y los recetados solamente como se lo haya indicado el mdico.  Si le recetaron un antibitico, tmelo como se lo haya indicado el mdico. No deje de tomar el antibitico aunque comience a sentirse mejor. Actividad  Retome sus actividades normales de a poco como se lo haya indicado el mdico. Pregntele al mdico qu actividades son seguras para usted.  Descanse todo lo que pueda. Trate de descansar o tomar una siesta mientras el beb duerme. Comida y bebida   Beba suficiente lquido como para mantener la orina de color amarillo plido.  Coma alimentos ricos en fibras todos los das. Estos pueden ayudarla a prevenir o aliviar el estreimiento. Los alimentos ricos en fibras incluyen, entre otros: ? Panes y cereales integrales. ? Arroz integral. ? Frijoles. ? Frutas y verduras frescas.  No intente perder de peso rpidamente reduciendo el consumo de caloras.  Tome sus vitaminas prenatales hasta la visita de seguimiento de posparto o hasta que su mdico le indique que puede dejar de tomarlas. Estilo de vida  No consuma ningn producto que contenga nicotina o tabaco, como cigarrillos y cigarrillos electrnicos. Si necesita ayuda para dejar de fumar, consulte al mdico.  No beba alcohol, especialmente si est amamantando. Instrucciones generales  Concurra a todas las visitas de seguimiento para usted y el beb, como se lo haya indicado el mdico. La mayora de las mujeres  visita al mdico para un seguimiento de posparto dentro de las primeras 3 a 6 semanas despus del parto. Comunquese con un mdico si:  Se siente incapaz de controlar los cambios que implica tener un hijo y esos sentimientos no desaparecen.  Siente tristeza o preocupacin de forma inusual.  Las mamas se ponen rojas, le duelen o se endurecen.  Tiene fiebre.  Tiene dificultad para retener la orina o para impedir que la orina se escape.  Tiene poco inters o falta de inters en actividades que solan gustarle.  No ha amamantado nada y no ha tenido un perodo menstrual durante 12 semanas despus del parto.  Dej de amamantar al beb y no ha tenido su perodo menstrual durante 12 semanas despus de dejar de amamantar.  Tiene preguntas sobre su cuidado y el del beb.  Elimina un cogulo de sangre grande por la vagina. Solicite ayuda de inmediato si:  Siente dolor en el pecho.  Tiene dificultad para respirar.  Tiene un dolor repentino e intenso en la pierna.  Tiene dolor intenso o clicos en el la parte inferior del abdomen.  Tiene una hemorragia tan intensa de la vagina que empapa ms de un apsito en una hora. El sangrado   no debe ser ms abundante que el perodo ms intenso que haya tenido.  Dolor de cabeza intenso.  Se desmaya.  Tiene visin borrosa o manchas en la vista.  Tiene secrecin vaginal con mal olor.  Tiene pensamientos acerca de lastimarse a usted misma o a su beb. Si alguna vez siente que puede lastimarse a usted misma o a otras personas, o tiene pensamientos de poner fin a su vida, busque ayuda de inmediato. Puede dirigirse al departamento de emergencias ms cercano o llamar a:  El servicio de emergencias de su localidad (911 en [place]  Una lnea de asistencia al suicida y atencin en crisis, como la Lnea Nacional de Prevencin del Suicidio (National Suicide Prevention Lifeline), al 1-800-273-8255. Est disponible las 24 horas del da. Resumen  El  perodo de tiempo justo despus el parto y hasta 6 a 12 semanas despus del parto se denomina perodo posparto.  Retome sus actividades normales de a poco como se lo haya indicado el mdico.  Concurra a todas las visitas de seguimiento para usted y el beb, como se lo haya indicado el mdico. Esta informacin no tiene como fin reemplazar el consejo del mdico. Asegrese de hacerle al mdico cualquier pregunta que tenga. Document Revised: 05/10/2017 Document Reviewed: 01/19/2017 Elsevier Patient Education  2020 [employer]  

## 2020-01-16 ENCOUNTER — Inpatient Hospital Stay (HOSPITAL_COMMUNITY)
Admission: EM | Admit: 2020-01-16 | Discharge: 2020-01-23 | DRG: 418 | Disposition: A | Discharge status: Home / Self Care | Payer: Medicaid Other | Attending: Internal Medicine | Admitting: Internal Medicine

## 2020-01-16 ENCOUNTER — Emergency Department (HOSPITAL_COMMUNITY): Payer: Medicaid Other

## 2020-01-16 ENCOUNTER — Other Ambulatory Visit: Payer: Self-pay

## 2020-01-16 ENCOUNTER — Encounter (HOSPITAL_COMMUNITY): Payer: Self-pay | Admitting: Emergency Medicine

## 2020-01-16 DIAGNOSIS — Z79899 Other long term (current) drug therapy: Secondary | ICD-10-CM | POA: Diagnosis not present

## 2020-01-16 DIAGNOSIS — E876 Hypokalemia: Secondary | ICD-10-CM | POA: Diagnosis present

## 2020-01-16 DIAGNOSIS — Z6831 Body mass index (BMI) 31.0-31.9, adult: Secondary | ICD-10-CM | POA: Diagnosis not present

## 2020-01-16 DIAGNOSIS — D509 Iron deficiency anemia, unspecified: Secondary | ICD-10-CM | POA: Diagnosis present

## 2020-01-16 DIAGNOSIS — K851 Biliary acute pancreatitis without necrosis or infection: Principal | ICD-10-CM | POA: Diagnosis present

## 2020-01-16 DIAGNOSIS — Z20822 Contact with and (suspected) exposure to covid-19: Secondary | ICD-10-CM | POA: Diagnosis present

## 2020-01-16 DIAGNOSIS — J189 Pneumonia, unspecified organism: Secondary | ICD-10-CM

## 2020-01-16 DIAGNOSIS — Z419 Encounter for procedure for purposes other than remedying health state, unspecified: Secondary | ICD-10-CM

## 2020-01-16 DIAGNOSIS — K8064 Calculus of gallbladder and bile duct with chronic cholecystitis without obstruction: Secondary | ICD-10-CM | POA: Diagnosis present

## 2020-01-16 DIAGNOSIS — N920 Excessive and frequent menstruation with regular cycle: Secondary | ICD-10-CM | POA: Diagnosis present

## 2020-01-16 DIAGNOSIS — R1013 Epigastric pain: Secondary | ICD-10-CM

## 2020-01-16 DIAGNOSIS — R112 Nausea with vomiting, unspecified: Secondary | ICD-10-CM

## 2020-01-16 DIAGNOSIS — D500 Iron deficiency anemia secondary to blood loss (chronic): Secondary | ICD-10-CM | POA: Diagnosis not present

## 2020-01-16 DIAGNOSIS — M7989 Other specified soft tissue disorders: Secondary | ICD-10-CM | POA: Diagnosis not present

## 2020-01-16 DIAGNOSIS — Z833 Family history of diabetes mellitus: Secondary | ICD-10-CM

## 2020-01-16 DIAGNOSIS — E669 Obesity, unspecified: Secondary | ICD-10-CM | POA: Diagnosis present

## 2020-01-16 DIAGNOSIS — R509 Fever, unspecified: Secondary | ICD-10-CM | POA: Diagnosis not present

## 2020-01-16 DIAGNOSIS — R300 Dysuria: Secondary | ICD-10-CM | POA: Diagnosis present

## 2020-01-16 DIAGNOSIS — K859 Acute pancreatitis without necrosis or infection, unspecified: Secondary | ICD-10-CM | POA: Diagnosis present

## 2020-01-16 LAB — COMPREHENSIVE METABOLIC PANEL
ALT: 23 U/L (ref 0–44)
AST: 33 U/L (ref 15–41)
Albumin: 3.9 g/dL (ref 3.5–5.0)
Alkaline Phosphatase: 99 U/L (ref 38–126)
Anion gap: 11 (ref 5–15)
BUN: 9 mg/dL (ref 6–20)
CO2: 23 mmol/L (ref 22–32)
Calcium: 9.1 mg/dL (ref 8.9–10.3)
Chloride: 102 mmol/L (ref 98–111)
Creatinine, Ser: 0.68 mg/dL (ref 0.44–1.00)
GFR, Estimated: >60 mL/min (ref >60)
Glucose, Bld: 185 mg/dL — ABNORMAL HIGH (ref 70–99)
Potassium: 3.7 mmol/L (ref 3.5–5.1)
Sodium: 136 mmol/L (ref 135–145)
Total Bilirubin: 0.7 mg/dL (ref 0.3–1.2)
Total Protein: 7.2 g/dL (ref 6.5–8.1)

## 2020-01-16 LAB — URINALYSIS, ROUTINE W REFLEX MICROSCOPIC
Bilirubin Urine: NEGATIVE
Glucose, UA: 150 mg/dL — AB
Hgb urine dipstick: NEGATIVE
Ketones, ur: NEGATIVE mg/dL
Leukocytes,Ua: NEGATIVE
Nitrite: NEGATIVE
Protein, ur: 30 mg/dL — AB
Specific Gravity, Urine: 1.021 (ref 1.005–1.030)
pH: 8 (ref 5.0–8.0)

## 2020-01-16 LAB — CBC
HCT: 37.5 % (ref 36.0–46.0)
Hemoglobin: 11.3 g/dL — ABNORMAL LOW (ref 12.0–15.0)
MCH: 24.3 pg — ABNORMAL LOW (ref 26.0–34.0)
MCHC: 30.1 g/dL (ref 30.0–36.0)
MCV: 80.6 fL (ref 80.0–100.0)
Platelets: 282 10*3/uL (ref 150–400)
RBC: 4.65 MIL/uL (ref 3.87–5.11)
RDW: 14.5 % (ref 11.5–15.5)
WBC: 12.1 10*3/uL — ABNORMAL HIGH (ref 4.0–10.5)
nRBC: 0 % (ref 0.0–0.2)

## 2020-01-16 LAB — LIPASE, BLOOD: Lipase: 5629 U/L — ABNORMAL HIGH (ref 11–51)

## 2020-01-16 LAB — RESP PANEL BY RT-PCR (FLU A&B, COVID) ARPGX2
Influenza A by PCR: NEGATIVE
Influenza B by PCR: NEGATIVE
SARS Coronavirus 2 by RT PCR: NEGATIVE

## 2020-01-16 LAB — I-STAT BETA HCG BLOOD, ED (MC, WL, AP ONLY): I-stat hCG, quantitative: <5 m[IU]/mL (ref <5)

## 2020-01-16 MED ORDER — ONDANSETRON HCL 4 MG/2ML IJ SOLN
4.0000 mg | Freq: Four times a day (QID) | INTRAMUSCULAR | Status: DC | PRN
  Administered 2020-01-22: 4 mg via INTRAVENOUS

## 2020-01-16 MED ORDER — FENTANYL CITRATE (PF) 100 MCG/2ML IJ SOLN
50.0000 ug | INTRAMUSCULAR | Status: DC | PRN
  Administered 2020-01-17 – 2020-01-19 (×7): 50 ug via INTRAVENOUS
  Filled 2020-01-16 (×7): qty 2

## 2020-01-16 MED ORDER — FENTANYL CITRATE (PF) 100 MCG/2ML IJ SOLN
50.0000 ug | Freq: Once | INTRAMUSCULAR | Status: AC
  Administered 2020-01-16: 50 ug via INTRAVENOUS
  Filled 2020-01-16: qty 2

## 2020-01-16 MED ORDER — LACTATED RINGERS IV SOLN: INTRAVENOUS | Status: DC

## 2020-01-16 MED ORDER — IOHEXOL 300 MG/ML  SOLN
100.0000 mL | Freq: Once | INTRAMUSCULAR | Status: AC | PRN
  Administered 2020-01-16: 100 mL via INTRAVENOUS

## 2020-01-16 MED ORDER — SODIUM CHLORIDE 0.9 % IV BOLUS
1000.0000 mL | Freq: Once | INTRAVENOUS | Status: AC
  Administered 2020-01-16: 1000 mL via INTRAVENOUS

## 2020-01-16 NOTE — ED Notes (Signed)
IV team at bedside 

## 2020-01-16 NOTE — ED Notes (Signed)
Pt transported to CT ?

## 2020-01-16 NOTE — ED Provider Notes (Signed)
[facility] EMERGENCY DEPARTMENT Kristy Phillips Note   CSN: [id number] Arrival date & time: 01/16/20  1726     History Chief Complaint  Patient presents with  . Abdominal Pain   Spanish video interpreter used throughout this visit. Kristy Phillips is a 39 y.o. female history of obesity, breast-feeding her 29-month-old.  Patient reports that she was cooking dinner today at 5 PM when she had sudden onset epigastric abdominal pain burning severe constant nonradiating no clear aggravating or alleviating factors no similar pain in the past.  Associated with nausea and one episode of nonbloody/nonbilious emesis.  Denies fever/chills, recent illness, injury/trauma, chest pain/shortness of breath, diarrhea, dysuria/hematuria, extremity swelling/color change or any additional concerns. HPI     Past Medical History:  Diagnosis Date  . Medical history non-contributory     Patient Active Problem List   Diagnosis Date Noted  . Labor and delivery, indication for care 03/07/2019  . AMA (advanced maternal age) multigravida 35+ 03/07/2019    Past Surgical History:  Procedure Laterality Date  . NO PAST SURGERIES       OB History    Gravida  5   Para  5   Term  5   Preterm      AB      Living  5     SAB      TAB      Ectopic      Multiple  0   Live Births  1           Family History  Problem Relation Age of Onset  . Diabetes Mother   . Diabetes Maternal Aunt   . Diabetes Maternal Uncle   . Diabetes Maternal Grandmother     Social History   Tobacco Use  . Smoking status: Never Smoker  . Smokeless tobacco: Never Used  Vaping Use  . Vaping Use: Never used  Substance Use Topics  . Alcohol use: Not Currently  . Drug use: Never    Home Medications Prior to Admission medications   Medication Sig Start Date End Date Taking? Authorizing Kristy Phillips  acetaminophen (TYLENOL) 325 MG tablet Take 2 tablets (650 mg total) by mouth every 6 (six) hours  as needed (for pain scale < 4). 03/08/19   Fair, Kristy Sauer, MD  ibuprofen (ADVIL) 600 MG tablet Take 1 tablet (600 mg total) by mouth every 6 (six) hours. 03/08/19   Fair, Kristy Sauer, MD  Prenatal Vit-Fe Fumarate-FA (PRENATAL VITAMIN PO) Take by mouth.    Kristy Phillips, Historical, MD    Allergies    Patient has no known allergies.  Review of Systems   Review of Systems Ten systems are reviewed and are negative for acute change except as noted in the HPI  Physical Exam Updated Vital Signs BP 133/79   Pulse 70   Temp 98.4 F (36.9 C) (Oral)   Resp (!) 22   LMP 01/06/2020   SpO2 100%   Breastfeeding Yes   Physical Exam Constitutional:      General: She is not in acute distress.    Appearance: Normal appearance. She is well-developed. She is not ill-appearing or diaphoretic.  HENT:     Head: Normocephalic and atraumatic.  Eyes:     General: Vision grossly intact. Gaze aligned appropriately.     Pupils: Pupils are equal, round, and reactive to light.  Neck:     Trachea: Trachea and phonation normal.  Pulmonary:     Effort: Pulmonary effort is normal. No  respiratory distress.  Abdominal:     General: There is no distension.     Palpations: Abdomen is soft.     Tenderness: There is abdominal tenderness in the right upper quadrant, epigastric area and left upper quadrant. There is no guarding or rebound.  Musculoskeletal:        General: Normal range of motion.     Cervical back: Normal range of motion.  Skin:    General: Skin is warm and dry.  Neurological:     Mental Status: She is alert.     GCS: GCS eye subscore is 4. GCS verbal subscore is 5. GCS motor subscore is 6.     Comments: Speech is clear and goal oriented, follows commands Major Cranial nerves without deficit, no facial droop Moves extremities without ataxia, coordination intact  Psychiatric:        Behavior: Behavior normal.     ED Results / Procedures / Treatments   Labs (all labs ordered are listed, but only  abnormal results are displayed) Labs Reviewed  LIPASE, BLOOD - Abnormal; Notable for the following components:      Result Value   Lipase 5,629 (*)    All other components within normal limits  COMPREHENSIVE METABOLIC PANEL - Abnormal; Notable for the following components:   Glucose, Bld 185 (*)    All other components within normal limits  CBC - Abnormal; Notable for the following components:   WBC 12.1 (*)    Hemoglobin 11.3 (*)    MCH 24.3 (*)    All other components within normal limits  URINALYSIS, ROUTINE W REFLEX MICROSCOPIC - Abnormal; Notable for the following components:   APPearance TURBID (*)    Glucose, UA 150 (*)    Protein, ur 30 (*)    Bacteria, UA RARE (*)    All other components within normal limits  RESP PANEL BY RT-PCR (FLU A&B, COVID) ARPGX2  I-STAT BETA HCG BLOOD, ED ([facility], [facility], [facility] ONLY)    EKG EKG Interpretation  Date/Time:  Sunday January 16 2020 17:55:59 EST Ventricular Rate:  82 PR Interval:  154 QRS Duration: 84 QT Interval:  362 QTC Calculation: 422 R Axis:   102 Text Interpretation: Normal sinus rhythm Rightward axis Borderline ECG No old tracing to compare Confirmed by Jacalyn Lefevre [phone]) on 01/16/2020 9:01:27 PM   Radiology CT ABDOMEN PELVIS W CONTRAST  Result Date: 01/16/2020 CLINICAL DATA:  Pancreatitis.  Nausea and vomiting. EXAM: CT ABDOMEN AND PELVIS WITH CONTRAST TECHNIQUE: Multidetector CT imaging of the abdomen and pelvis was performed using the standard protocol following bolus administration of intravenous contrast. CONTRAST:  OMNIPAQUE IOHEXOL 300 MG/ML  SOLN COMPARISON:  None. FINDINGS: Lower chest: The lung bases are clear. The heart size is normal. Hepatobiliary: The liver is normal. Cholelithiasis without acute inflammation.There is no biliary ductal dilation. Pancreas: There is peripancreatic fat stranding with peripancreatic free fluid. The pancreas enhances uniformly. Spleen: Unremarkable. Adrenals/Urinary Tract:  --Adrenal glands: Unremarkable. --Right kidney/ureter: No hydronephrosis or radiopaque kidney stones. --Left kidney/ureter: No hydronephrosis or radiopaque kidney stones. --Urinary bladder: Unremarkable. Stomach/Bowel: --Stomach/Duodenum: No hiatal hernia or other gastric abnormality. Normal duodenal course and caliber. --Small bowel: Unremarkable. --Colon: Unremarkable. --Appendix: Normal. Vascular/Lymphatic: Normal course and caliber of the major abdominal vessels. The splenic vein remains patent. --No retroperitoneal lymphadenopathy. --No mesenteric lymphadenopathy. --No pelvic or inguinal lymphadenopathy. Reproductive: Unremarkable Other: There is a small amount of free fluid in the abdomen pelvis. There is a small fat containing umbilical hernia. Musculoskeletal. No acute  displaced fractures. IMPRESSION: 1. Acute pancreatitis without evidence for pancreatic necrosis. 2. Cholelithiasis without acute inflammation. 3. Small amount of free fluid in the abdomen and pelvis. Electronically Signed   By: Katherine Mantle M.D.   On: 01/16/2020 22:53    Procedures Procedures (including critical care time)  Medications Ordered in ED Medications  fentaNYL (SUBLIMAZE) injection 50 mcg (50 mcg Intravenous Given 01/16/20 2211)  sodium chloride 0.9 % bolus 1,000 mL (1,000 mLs Intravenous New Bag/Given 01/16/20 2211)  iohexol (OMNIPAQUE) 300 MG/ML solution 100 mL (100 mLs Intravenous Contrast Given 01/16/20 2228)    ED Course  I have reviewed the triage vital signs and the nursing notes.  Pertinent labs & imaging results that were available during my care of the patient were reviewed by me and considered in my medical decision making (see chart for details).    MDM Rules/Calculators/A&P                         Additional history obtained from: 1. Nursing notes from this visit. 2. Review of electronic medical records. ---------- 39 year old female arrived with sudden onset epigastric panel cooking dinner  today around 5 PM.  On exam she is well-appearing and in no acute distress.  She has some epigastric tenderness to palpation no guarding or rebound on exam.  Abdominal pain labs were obtained in triage and are reviewed below:  CBC shows leukocytosis of 12.1, mild anemia of 11.3. CMP shows no emergent electrolyte derangement, AKI, LFT elevations or gap. Urinalysis without evidence of infection. Pregnancy test negative. Lipase significantly elevated at 5629. EKG: Normal sinus rhythm Rightward axis Borderline ECG No old tracing to compare Confirmed by Jacalyn Lefevre [phone]) on 01/16/2020 9:01:27 PM  Suspicion is high for pancreatitis, given sudden onset will obtain CT abdomen pelvis for further evaluation of patient's pancreas as well as for other intra-abdominal pathologies.  Plan of care discussed with Dr. Particia Nearing who agrees.  Covid test, pain medication and fluids ordered. -------------- COVID/Influenza panel negative. CT [facility]:    IMPRESSION:  1. Acute pancreatitis without evidence for pancreatic necrosis.  2. Cholelithiasis without acute inflammation.  3. Small amount of free fluid in the abdomen and pelvis.   Consult placed to hospitalist service for admission.  Secure chat sent to on-call gastroenterologist Dr. Barron Alvine who advised that GI day team will see patient. - Patient reassessed resting comfortably VSS. Reports pain improved. States understanding of findings and is agreeable for admission. - 11:36 PM: Consult called to Dr. Arlean Hopping and patient was accepted to hospitalist service.   Note: Portions of this report may have been transcribed using voice recognition software. Every effort was made to ensure accuracy; however, inadvertent computerized transcription errors may still be present. Final Clinical Impression(s) / ED Diagnoses Final diagnoses:  Acute pancreatitis without infection or necrosis, unspecified pancreatitis type    Rx / DC Orders ED Discharge Orders    None        Elizabeth Palau 01/16/20 2337    Jacalyn Lefevre, MD 01/19/20 [phone]

## 2020-01-16 NOTE — H&P (Addendum)
History and Physical    PLEASE NOTE THAT DRAGON DICTATION SOFTWARE WAS USED IN THE CONSTRUCTION OF THIS NOTE.   Kristy Phillips PFX:[id number] DOB: 01-10-1981 DOA: 01/16/2020  PCP: Pcp, No Patient coming from: home   I have personally briefly reviewed patient's old medical records in [facility]  Chief Complaint: Abdominal pain  HPI: Kristy Phillips is a 39 y.o. female with no significant past medical history, who is admitted to [facility] on 01/16/2020 with acute pancreatitis after presenting from home to [facility] Emergency Department complaining of abdominal pain.  Of note, the patient is Spanish speaking predominant, and I used the translation service to obtain the following history:  The patient reports 1 day of intermittent epigastric discomfort. She describes the pain as sharp in nature, with radiation to the left upper abdominal quadrant. Reports worsening of the pain with palpation over the epigastrium or left upper quadrant. She also reports associated intermittent nausea resulting in one episode of nonbloody, nonbilious emesis that occurred prior to arrival in the ED today. Denies any associated diarrhea, melena, or hematochezia. She also denies any recent dysuria, gross hematuria, or urinary urgency/frequency. Denies any associated chest pain, diaphoresis, palpitations, presyncope, or syncope. No recent trauma. Denies any previous abdominal pain similar to that with which she presents this evening. Denies any known prior history of acute pancreatitis. Denies any alcohol consumption or recent use of antibiotics. She is reportedly breast-feeding her 38-month-old child at home.  Denies any recent subjective fever, chills, rigors, or generalized myalgias. Denies any recent headache, neck stiffness, rhinorrhea, rhinitis, sore throat, shortness of breath, wheezing, cough.      ED Course:  Vital signs in the ED were notable for the following: Temperature max 98.4;  heart rate initially noted to be 101, which decreased to the range of 65-84; following interval the fluids as well has as needed IV pain medications, as further described below; blood pressure initially noted to be 161/84, which is decreased to the range of 130/74 - 140/83; following interval as needed IV fentanyl, as further described below; respiratory rate 18-22; oxygen saturation 99 to 100% on room air.  Labs were notable for the following: CMP was notable for the following: Sodium 136, potassium 3.7, parented twenty-three, BUN nine, creatinine 0.68, calcium 9.1, albumin 3.9, alkaline phosphatase ninety-nine, AST thirty-three, ALT twenty-three, total bilirubin 0.7. Lipase 5600. Quantitative beta hCG less than 5.0. Urinalysis showed no white blood cells, rare bacteria, and was negative for nitrates as well as leukocyte esterase. CBC was notable for white blood cell count of 12,100. Covid 19/influenza PCR were performed in the ED this evening and found to be negative.  CT abdomen/pelvis with contrast showed peripancreatic fat stranding with peripancreatic free fluid suggestive of acute pancreatitis without evidence of necrosis, CT abdomen/pelvis also showed a normal-appearing liver as well as cholelithiasis without evidence of gallbladder wall thickening or common bile duct dilation, and showed no evidence of choledocholithiasis. Additionally, no evidence of bowel obstruction, abscess, or perforation. Presenting EKG shows normal sinus rhythm with heart rate eighty-two, QTc 422 MS, nonspecific T wave inversion in V1, with no prior EKG available for point comparison, no evidence of ST changes, including no evidence of ST elevation.  The patient's case and imaging were discussed with the on-call gastroenterologist, Dr. Barron Alvine, who recommended admission to the hospitalist service for additional evaluation and management of suspected acute pancreatitis, including supportive measures to include n.p.o., IV  fluids, and as needed IV analgesics and as needed antiemetics.  Dr. Barron Alvine did not recommend any additional imaging at this time, including no abdominal ultrasound, but plans to evaluate the patient in person in the morning (01/17/20).   While in the ED, the following were administered: Fentanyl 50 mcg IV x1, and a 1 L normal saline bolus. Subsequently, the patient was admitted to the MedSurg floor for further evaluation and management of suspected presenting acute pancreatitis.    Review of Systems: As per HPI otherwise 10 point review of systems negative.   Past Medical History:  Diagnosis Date  . Medical history non-contributory     Past Surgical History:  Procedure Laterality Date  . NO PAST SURGERIES      Social History:  reports that she has never smoked. She has never used smokeless tobacco. She reports previous alcohol use. She reports that she does not use drugs.   No Known Allergies  Family History  Problem Relation Age of Onset  . Diabetes Mother   . Diabetes Maternal Aunt   . Diabetes Maternal Uncle   . Diabetes Maternal Grandmother      Prior to Admission medications   Medication Sig Start Date End Date Taking? Authorizing Provider  acetaminophen (TYLENOL) 325 MG tablet Take 2 tablets (650 mg total) by mouth every 6 (six) hours as needed (for pain scale < 4). 03/08/19   Fair, Hoyle Sauer, MD  ibuprofen (ADVIL) 600 MG tablet Take 1 tablet (600 mg total) by mouth every 6 (six) hours. 03/08/19   Fair, Hoyle Sauer, MD  Prenatal Vit-Fe Fumarate-FA (PRENATAL VITAMIN PO) Take by mouth.    [provider]     Objective    Physical Exam: Vitals:   01/16/20 2145 01/16/20 2215 01/16/20 2230 01/16/20 2300  BP: 134/78 132/79 136/84 133/79  Pulse: 66 66 78 70  Resp: 20 18 (!) 22 (!) 22  Temp:      TempSrc:      SpO2: 100% 100% 100% 100%    General: appears to be stated age; alert, oriented Skin: warm, dry, no rash Head:  AT/[place] Mouth:  Oral mucosa  membranes appear dry, normal dentition Neck: supple; trachea midline Heart:  RRR; did not appreciate any M/R/G Lungs: CTAB, did not appreciate any wheezes, rales, or rhonchi Abdomen: + BS; soft, ND; mild tenderness in the epigastrium as well as the left upper quadrant in the absence of any associated guarding, rigidity, or rebound tenderness. Vascular: 2+ pedal pulses b/l; 2+ radial pulses b/l Extremities: no peripheral edema, no muscle wasting Neuro: strength and sensation intact in upper and lower extremities b/l    Labs on Admission: I have personally reviewed following labs and imaging studies  CBC: Recent Labs  Lab 01/16/20 1800  WBC 12.1*  HGB 11.3*  HCT 37.5  MCV 80.6  PLT 282   Basic Metabolic Panel: Recent Labs  Lab 01/16/20 1800  NA 136  K 3.7  CL 102  CO2 23  GLUCOSE 185*  BUN 9  CREATININE 0.68  CALCIUM 9.1   GFR: CrCl cannot be calculated (Unknown ideal weight.). Liver Function Tests: Recent Labs  Lab 01/16/20 1800  AST 33  ALT 23  ALKPHOS 99  BILITOT 0.7  PROT 7.2  ALBUMIN 3.9   Recent Labs  Lab 01/16/20 1800  LIPASE 5,629*   No results for input(s): AMMONIA in the last 168 hours. Coagulation Profile: No results for input(s): INR, PROTIME in the last 168 hours. Cardiac Enzymes: No results for input(s): CKTOTAL, CKMB, CKMBINDEX, TROPONINI in the last 168  hours. BNP (last 3 results) No results for input(s): PROBNP in the last 8760 hours. HbA1C: No results for input(s): HGBA1C in the last 72 hours. CBG: No results for input(s): GLUCAP in the last 168 hours. Lipid Profile: No results for input(s): CHOL, HDL, LDLCALC, TRIG, CHOLHDL, LDLDIRECT in the last 72 hours. Thyroid Function Tests: No results for input(s): TSH, T4TOTAL, FREET4, T3FREE, THYROIDAB in the last 72 hours. Anemia Panel: No results for input(s): VITAMINB12, FOLATE, FERRITIN, TIBC, IRON, RETICCTPCT in the last 72 hours. Urine analysis:    Component Value Date/Time    COLORURINE YELLOW 01/16/2020 1809   APPEARANCEUR TURBID (A) 01/16/2020 1809   LABSPEC 1.021 01/16/2020 1809   PHURINE 8.0 01/16/2020 1809   GLUCOSEU 150 (A) 01/16/2020 1809   HGBUR NEGATIVE 01/16/2020 1809   BILIRUBINUR NEGATIVE 01/16/2020 1809   KETONESUR NEGATIVE 01/16/2020 1809   PROTEINUR 30 (A) 01/16/2020 1809   UROBILINOGEN 0.2 07/18/2008 1618   NITRITE NEGATIVE 01/16/2020 1809   LEUKOCYTESUR NEGATIVE 01/16/2020 1809    Radiological Exams on Admission: CT ABDOMEN PELVIS W CONTRAST  Result Date: 01/16/2020 CLINICAL DATA:  Pancreatitis.  Nausea and vomiting. EXAM: CT ABDOMEN AND PELVIS WITH CONTRAST TECHNIQUE: Multidetector CT imaging of the abdomen and pelvis was performed using the standard protocol following bolus administration of intravenous contrast. CONTRAST:  OMNIPAQUE IOHEXOL 300 MG/ML  SOLN COMPARISON:  None. FINDINGS: Lower chest: The lung bases are clear. The heart size is normal. Hepatobiliary: The liver is normal. Cholelithiasis without acute inflammation.There is no biliary ductal dilation. Pancreas: There is peripancreatic fat stranding with peripancreatic free fluid. The pancreas enhances uniformly. Spleen: Unremarkable. Adrenals/Urinary Tract: --Adrenal glands: Unremarkable. --Right kidney/ureter: No hydronephrosis or radiopaque kidney stones. --Left kidney/ureter: No hydronephrosis or radiopaque kidney stones. --Urinary bladder: Unremarkable. Stomach/Bowel: --Stomach/Duodenum: No hiatal hernia or other gastric abnormality. Normal duodenal course and caliber. --Small bowel: Unremarkable. --Colon: Unremarkable. --Appendix: Normal. Vascular/Lymphatic: Normal course and caliber of the major abdominal vessels. The splenic vein remains patent. --No retroperitoneal lymphadenopathy. --No mesenteric lymphadenopathy. --No pelvic or inguinal lymphadenopathy. Reproductive: Unremarkable Other: There is a small amount of free fluid in the abdomen pelvis. There is a small fat  containing umbilical hernia. Musculoskeletal. No acute displaced fractures. IMPRESSION: 1. Acute pancreatitis without evidence for pancreatic necrosis. 2. Cholelithiasis without acute inflammation. 3. Small amount of free fluid in the abdomen and pelvis. Electronically Signed   By: Katherine Mantle M.D.   On: 01/16/2020 22:53     EKG: Independently reviewed, with result as described above.    Assessment/Plan   Kristy Phillips is a 39 y.o. female with no significant past medical history, who is admitted to [facility] on 01/16/2020 with acute pancreatitis after presenting from home to [facility] Emergency Department complaining of abdominal pain.   Principal Problem:   Acute pancreatitis Active Problems:   Epigastric pain   Nausea & vomiting    #) Acute pancreatitis: New diagnosis, in the setting of presenting acute abdominal pain associated with nausea/vomiting, significantly elevated lipase, and presenting CT abdomen/pelvis findings that were notable for peripancreatic fat stranding as well as peripancreatic free fluid consistent with acute pancreatitis without evidence of necrosis, compressing mass, abscess, or pseudocyst. No evidence of associated infection, and the patient appears hemodynamically stable. Etiology is currently unclear. The patient denies any history of alcohol consumption. CT abdomen/pelvis shows cholelithiasis, but she has no evidence of additional gallbladder pathology, and no evidence of choledocholithiasis. A recently passed gallstone is a possibility, and may consider right upper  quadrant ultrasound for further evaluation. We will also repeat CMP in the morning to evaluate for any interval development of a cholestatic pattern. No overt evidence of pharmacologic contribution. Presenting labs are not suggestive of hypercalcemia, although will further evaluate with ionized calcium level. Will also check for hypertriglyceridemia.   The patient's case and  imaging were discussed with the on-call gastroenterologist, Dr. Barron Alvineirigliano, who recommended admission to the hospitalist service for additional evaluation and management of suspected acute pancreatitis, including supportive measures, NPO, and IVF's. Dr. Barron Alvineirigliano did not recommend any additional imaging at this time, including no abdominal ultrasound, but plans to evaluate the patient in person in the morning (01/17/20).    Plan: NPO. Lactated Ringer's at 150 cc/h. As needed IV fentanyl. As needed IV Zofran. Add on serum magnesium level. recheck CMP in the morning, with electrolyte supplementation as needed. Check ionized calcium level. Check serum triglyceride level. Add on serum ethanol level. Gastroenterology formally consulted, with, plan for Dr. Barron Alvineirigliano to evaluate the patient in the morning (01/17/20), as further described above.     #) Abdominal pain: 1 day of intermittent sharp epigastric pain radiating to the left upper quadrant, which appears to be consistent with acute pancreatitis, in the absence of any associated evidence of necrosis, as identified on presenting CT abdomen/pelvis, as further described above. Physical exam reveals no evidence of an acute surgical abdomen. Work-up and management for presenting acute pancreatitis, including supportive care with as needed IV fentanyl and as needed antiemetics, as above, with plan for GI to evaluate the patient morning. No evidence of underlying UTI.  Plan: Work-up and management of suspected presenting acute pancreatitis, as above. As needed IV fentanyl. Repeat CMP in the morning. GI consultation, as above. We will also start Protonix 4 mg IV daily in case there should be any contribution from underlying gastritis versus peptic ulcer disease.      #) Nausea/vomiting: 1 day of intermittent nausea resulting in one episode of nonbloody, nonbilious emesis, which appears associated with presenting acute pancreatitis. CT abdomen/pelvis shows no  evidence of obstruction. We will treat suspected underlying acute pancreatitis, including titration of conservative/supportive measures, including as needed IV antiemetics.  Plan: Work-up and management of presenting acute pancreatitis, as above. IV fluids as above. As needed IV Zofran, as above. Repeat CMP in the morning. Add on serum magnesium level, will plan for electrolyte supplementation on a as needed basis.      DVT prophylaxis: scd's  Code Status: Full code Family Communication: none Disposition Plan: Per Rounding Team Consults called: The patient's case and imaging were discussed with the on-call gastroenterologist, Dr. Barron Alvineirigliano, as further described above.  Admission status: Inpatient; MedSurg.     Of note, this patient was added by me to the following Admit List/Treatment Team: [email or web address]    PLEASE NOTE THAT DRAGON DICTATION SOFTWARE WAS USED IN THE CONSTRUCTION OF THIS NOTE.   Kristy Phillips B Quaneisha Hanisch DO [facility] Pager [phone] From 7PM- 7AM  01/16/2020, 11:39 PM

## 2020-01-16 NOTE — ED Triage Notes (Addendum)
Pt reports mid upper abd pain, nausea, and vomiting that started suddenly while cooking at 5pm.  Pt hyperventilating on arrival and initially reported tingling to hands.  Encouraged her to concentrate on slowing breathing down.

## 2020-01-17 ENCOUNTER — Encounter (HOSPITAL_COMMUNITY): Payer: Self-pay | Admitting: Internal Medicine

## 2020-01-17 DIAGNOSIS — R1013 Epigastric pain: Secondary | ICD-10-CM | POA: Diagnosis present

## 2020-01-17 DIAGNOSIS — R112 Nausea with vomiting, unspecified: Secondary | ICD-10-CM | POA: Diagnosis present

## 2020-01-17 DIAGNOSIS — K851 Biliary acute pancreatitis without necrosis or infection: Principal | ICD-10-CM

## 2020-01-17 LAB — PROTIME-INR
INR: 1.1 (ref 0.8–1.2)
Prothrombin Time: 13.5 seconds (ref 11.4–15.2)

## 2020-01-17 LAB — LIPASE, BLOOD: Lipase: 808 U/L — ABNORMAL HIGH (ref 11–51)

## 2020-01-17 LAB — COMPREHENSIVE METABOLIC PANEL
ALT: 68 U/L — ABNORMAL HIGH (ref 0–44)
AST: 66 U/L — ABNORMAL HIGH (ref 15–41)
Albumin: 3.2 g/dL — ABNORMAL LOW (ref 3.5–5.0)
Alkaline Phosphatase: 121 U/L (ref 38–126)
Anion gap: 10 (ref 5–15)
BUN: 6 mg/dL (ref 6–20)
CO2: 23 mmol/L (ref 22–32)
Calcium: 8.4 mg/dL — ABNORMAL LOW (ref 8.9–10.3)
Chloride: 106 mmol/L (ref 98–111)
Creatinine, Ser: 0.48 mg/dL (ref 0.44–1.00)
GFR, Estimated: >60 mL/min (ref >60)
Glucose, Bld: 110 mg/dL — ABNORMAL HIGH (ref 70–99)
Potassium: 3.3 mmol/L — ABNORMAL LOW (ref 3.5–5.1)
Sodium: 139 mmol/L (ref 135–145)
Total Bilirubin: 1.1 mg/dL (ref 0.3–1.2)
Total Protein: 6.2 g/dL — ABNORMAL LOW (ref 6.5–8.1)

## 2020-01-17 LAB — CBC
HCT: 35.2 % — ABNORMAL LOW (ref 36.0–46.0)
Hemoglobin: 10.8 g/dL — ABNORMAL LOW (ref 12.0–15.0)
MCH: 24.5 pg — ABNORMAL LOW (ref 26.0–34.0)
MCHC: 30.7 g/dL (ref 30.0–36.0)
MCV: 79.8 fL — ABNORMAL LOW (ref 80.0–100.0)
Platelets: 280 10*3/uL (ref 150–400)
RBC: 4.41 MIL/uL (ref 3.87–5.11)
RDW: 14.6 % (ref 11.5–15.5)
WBC: 10.5 10*3/uL (ref 4.0–10.5)
nRBC: 0 % (ref 0.0–0.2)

## 2020-01-17 LAB — IRON AND TIBC
Iron: 33 ug/dL (ref 28–170)
Saturation Ratios: 8 % — ABNORMAL LOW (ref 10.4–31.8)
TIBC: 405 ug/dL (ref 250–450)
UIBC: 372 ug/dL

## 2020-01-17 LAB — HIV ANTIBODY (ROUTINE TESTING W REFLEX): HIV Screen 4th Generation wRfx: NONREACTIVE

## 2020-01-17 LAB — MAGNESIUM: Magnesium: 1.6 mg/dL — ABNORMAL LOW (ref 1.7–2.4)

## 2020-01-17 LAB — TRIGLYCERIDES: Triglycerides: 65 mg/dL (ref <150)

## 2020-01-17 LAB — ETHANOL: Alcohol, Ethyl (B): <10 mg/dL (ref <10)

## 2020-01-17 MED ORDER — MAGNESIUM SULFATE 2 GM/50ML IV SOLN
2.0000 g | Freq: Once | INTRAVENOUS | Status: AC
  Administered 2020-01-17: 2 g via INTRAVENOUS
  Filled 2020-01-17: qty 50

## 2020-01-17 MED ORDER — PANTOPRAZOLE SODIUM 40 MG IV SOLR
40.0000 mg | Freq: Every day | INTRAVENOUS | Status: DC
  Administered 2020-01-17 – 2020-01-19 (×3): 40 mg via INTRAVENOUS
  Filled 2020-01-17 (×3): qty 40

## 2020-01-17 MED ORDER — POTASSIUM CHLORIDE CRYS ER 20 MEQ PO TBCR
40.0000 meq | EXTENDED_RELEASE_TABLET | Freq: Once | ORAL | Status: AC
  Administered 2020-01-17: 40 meq via ORAL
  Filled 2020-01-17: qty 2

## 2020-01-17 NOTE — ED Notes (Signed)
Kathlen Mody MD at bedside

## 2020-01-17 NOTE — Consult Note (Signed)
Referring Provider:  [facility]         Primary Care Physician:  Pcp, No Primary Gastroenterologist:             We were asked to see this patient for:                  ASSESSMENT / PLAN:    # 39 yo non-English speaking female with acute pancreatitis of unclear etiology but ? Passed gallstone. She has cholelithiasis but p;resenting LFTs normal and no biliary duct dilation on imaging. She doesn't consume etoh, triglycerides normal. No suspicious medications. This am her AST and ALT are both mildly elevated but no cholestasis.  --Continue supportive care with IVF, analgesics. Hct is 35, no signs of hemoconcentration. Renal function is normal.  --If no etiology of pancreatitis found she may need cholecystectomy at some point --am labs  # Hypomagnesemia / hypokalemia. Repletion per admitting team.   # Microcytic anemia. Hbg 11.3, at baseline on admission.  --Has heavy menstrual cycles  # Breastfeeding. She is pumping and saving milk    HPI:                                                                                                                             Chief Complaint: pancreatitis  Kristy BusingGloria Phillips is a 39 y.o. female with a pmh significant for, not necessarily limited to:   Non-English speaking female who presented to ED yesterday with sudden onset of epigastric pain, In ED her lipase was 5600, WBC 12.1. LFTs normal, renal function normal. Triglycerides 65. CT scan  abd/ pelvis with contrast notable for cholelithiasis, no biliary duct dilation, no evidence for acute cholecystitis. Peripancreatic fat stranding with peripancreatic free fluid. She got a liter bolus of NS last night at 1pm and currently has LR going at 150 ml / hr.   History obtained through use of video interpreter. She developed acute epigastric pain and nausea yesterday afternoon. Pain intermittent, radiates through to her back. Endorses subjective fever. She takes Tylenol and Omega 3, no other  medications / supplements. No Etoh use. No known FMH of pancreatic disease. She has no other complaints.    PREVIOUS ENDOSCOPIC EVALUATIONS / PERTINENT STUDIES   none   Past Medical History:  Diagnosis Date  . Medical history non-contributory     Past Surgical History:  Procedure Laterality Date  . NO PAST SURGERIES      Prior to Admission medications   Medication Sig Start Date End Date Taking? Authorizing Provider  acetaminophen (TYLENOL) 325 MG tablet Take 2 tablets (650 mg total) by mouth every 6 (six) hours as needed (for pain scale < 4). 03/08/19   Fair, Hoyle Sauerhelsea N, MD  ibuprofen (ADVIL) 600 MG tablet Take 1 tablet (600 mg total) by mouth every 6 (six) hours. 03/08/19   Fair, Hoyle Sauerhelsea N, MD  Prenatal Vit-Fe Fumarate-FA (PRENATAL VITAMIN PO) Take by mouth.    [provider]  Current Facility-Administered Medications  Medication Dose Route Frequency Provider Last Rate Last Admin  . fentaNYL (SUBLIMAZE) injection 50 mcg  50 mcg Intravenous Q2H PRN Howerter, Justin B, DO   50 mcg at 01/17/20 0225  . lactated ringers infusion   Intravenous Continuous Howerter, Justin B, DO 150 mL/hr at 01/17/20 0031 New Bag at 01/17/20 0031  . ondansetron (ZOFRAN) injection 4 mg  4 mg Intravenous Q6H PRN Howerter, Justin B, DO      . pantoprazole (PROTONIX) injection 40 mg  40 mg Intravenous Daily Howerter, Justin B, DO       Current Outpatient Medications  Medication Sig Dispense Refill  . acetaminophen (TYLENOL) 325 MG tablet Take 2 tablets (650 mg total) by mouth every 6 (six) hours as needed (for pain scale < 4). 30 tablet 0  . ibuprofen (ADVIL) 600 MG tablet Take 1 tablet (600 mg total) by mouth every 6 (six) hours. 30 tablet 0  . Prenatal Vit-Fe Fumarate-FA (PRENATAL VITAMIN PO) Take by mouth.      Allergies as of 01/16/2020  . (No Known Allergies)    Family History  Problem Relation Age of Onset  . Diabetes Mother   . Diabetes Maternal Aunt   . Diabetes Maternal Uncle    . Diabetes Maternal Grandmother     Social History   Socioeconomic History  . Marital status: Single    Spouse name: Not on file  . Number of children: Not on file  . Years of education: Not on file  . Highest education level: Not on file  Occupational History  . Not on file  Tobacco Use  . Smoking status: Never Smoker  . Smokeless tobacco: Never Used  Vaping Use  . Vaping Use: Never used  Substance and Sexual Activity  . Alcohol use: Not Currently  . Drug use: Never  . Sexual activity: Yes  Other Topics Concern  . Not on file  Social History Narrative  . Not on file   Social Determinants of Health   Financial Resource Strain:   . Difficulty of Paying Living Expenses: Not on file  Food Insecurity:   . Worried About Programme researcher, broadcasting/film/video in the Last Year: Not on file  . Ran Out of Food in the Last Year: Not on file  Transportation Needs:   . Lack of Transportation (Medical): Not on file  . Lack of Transportation (Non-Medical): Not on file  Physical Activity:   . Days of Exercise per Week: Not on file  . Minutes of Exercise per Session: Not on file  Stress:   . Feeling of Stress : Not on file  Social Connections:   . Frequency of Communication with Friends and Family: Not on file  . Frequency of Social Gatherings with Friends and Family: Not on file  . Attends Religious Services: Not on file  . Active Member of Clubs or Organizations: Not on file  . Attends Banker Meetings: Not on file  . Marital Status: Not on file  Intimate Partner Violence:   . Fear of Current or Ex-Partner: Not on file  . Emotionally Abused: Not on file  . Physically Abused: Not on file  . Sexually Abused: Not on file    Review of Systems: All systems reviewed and negative except where noted in HPI.    OBJECTIVE:    Physical Exam: Vital signs in last 24 hours: Temp:  [98.4 F (36.9 C)] 98.4 F (36.9 C) (12/05 1729) Pulse Rate:  [65-101] 95 (  12/06 0800) Resp:   [15-24] 17 (12/06 0800) BP: (117-161)/(74-87) 132/82 (12/06 0800) SpO2:  [98 %-100 %] 99 % (12/06 0800) Weight:  [81.6 kg] 81.6 kg (12/06 0036)   General:   Alert, well-developed, female in NAD Psych:  Pleasant, cooperative. Normal mood and affect. Eyes:  Pupils equal, sclera clear, no icterus.   Conjunctiva pink. Ears:  Normal auditory acuity. Nose:  No deformity, discharge,  or lesions. Neck:  Supple; no masses Lungs:  Clear throughout to auscultation.   No wheezes, crackles, or rhonchi.  Heart:  Regular rate and rhythm; soft murmur, no lower extremity edema Abdomen:  Soft, non-distended, moderate epigastric and LUQ tenderness, BS active, no palp mass   Rectal:  Deferred  Msk:  Symmetrical without gross deformities. . Neurologic:  Alert and  oriented x4;  grossly normal neurologically. Skin:  Intact without significant lesions or rashes.  Filed Weights   01/17/20 0036  Weight: 81.6 kg     Scheduled inpatient medications . pantoprazole (PROTONIX) IV  40 mg Intravenous Daily      Intake/Output from previous day: No intake/output data recorded. Intake/Output this shift: No intake/output data recorded.   Lab Results: Recent Labs    01/16/20 1800 01/17/20 0723  WBC 12.1* 10.5  HGB 11.3* 10.8*  HCT 37.5 35.2*  PLT 282 280   BMET Recent Labs    01/16/20 1800 01/17/20 0723  NA 136 139  K 3.7 3.3*  CL 102 106  CO2 23 23  GLUCOSE 185* 110*  BUN 9 6  CREATININE 0.68 0.48  CALCIUM 9.1 8.4*   LFT Recent Labs    01/17/20 0723  PROT 6.2*  ALBUMIN 3.2*  AST 66*  ALT 68*  ALKPHOS 121  BILITOT 1.1   PT/INR Recent Labs    01/17/20 0723  LABPROT 13.5  INR 1.1   Hepatitis Panel No results for input(s): HEPBSAG, HCVAB, HEPAIGM, HEPBIGM in the last 72 hours.   . CBC Latest Ref Rng & Units 01/17/2020 01/16/2020 03/07/2019  WBC 4.0 - 10.5 K/uL 10.5 12.1(H) 8.7  Hemoglobin 12.0 - 15.0 g/dL 10.8(L) 11.3(L) 11.2(L)  Hematocrit 36 - 46 % 35.2(L) 37.5 35.2(L)   Platelets 150 - 400 K/uL 280 282 220    . CMP Latest Ref Rng & Units 01/17/2020 01/16/2020 08/29/2018  Glucose 70 - 99 mg/dL 448(J) 856(D) 90  BUN 6 - 20 mg/dL 6 9 <1(S)  Creatinine 9.70 - 1.00 mg/dL 2.63 7.85 8.85  Sodium 135 - 145 mmol/L 139 136 138  Potassium 3.5 - 5.1 mmol/L 3.3(L) 3.7 3.9  Chloride 98 - 111 mmol/L 106 102 107  CO2 22 - 32 mmol/L 23 23 22   Calcium 8.9 - 10.3 mg/dL ) 9.1 9.3  Total Protein 6.5 - 8.1 g/dL 6.2(L) 7.2 7.2  Total Bilirubin 0.3 - 1.2 mg/dL 1.1 0.7 0.7  Alkaline Phos 38 - 126 U/L 121 99 65  AST 15 - 41 U/L 66(H) 33 16  ALT 0 - 44 U/L 68(H) 23 17   Studies/Results: CT ABDOMEN PELVIS W CONTRAST  Result Date: 01/16/2020 CLINICAL DATA:  Pancreatitis.  Nausea and vomiting. EXAM: CT ABDOMEN AND PELVIS WITH CONTRAST TECHNIQUE: Multidetector CT imaging of the abdomen and pelvis was performed using the standard protocol following bolus administration of intravenous contrast. CONTRAST:  14/06/2019 OMNIPAQUE IOHEXOL 300 MG/ML  SOLN COMPARISON:  None. FINDINGS: Lower chest: The lung bases are clear. The heart size is normal. Hepatobiliary: The liver is normal. Cholelithiasis without acute inflammation.There is no biliary ductal  dilation. Pancreas: There is peripancreatic fat stranding with peripancreatic free fluid. The pancreas enhances uniformly. Spleen: Unremarkable. Adrenals/Urinary Tract: --Adrenal glands: Unremarkable. --Right kidney/ureter: No hydronephrosis or radiopaque kidney stones. --Left kidney/ureter: No hydronephrosis or radiopaque kidney stones. --Urinary bladder: Unremarkable. Stomach/Bowel: --Stomach/Duodenum: No hiatal hernia or other gastric abnormality. Normal duodenal course and caliber. --Small bowel: Unremarkable. --Colon: Unremarkable. --Appendix: Normal. Vascular/Lymphatic: Normal course and caliber of the major abdominal vessels. The splenic vein remains patent. --No retroperitoneal lymphadenopathy. --No mesenteric lymphadenopathy. --No pelvic or  inguinal lymphadenopathy. Reproductive: Unremarkable Other: There is a small amount of free fluid in the abdomen pelvis. There is a small fat containing umbilical hernia. Musculoskeletal. No acute displaced fractures. IMPRESSION: 1. Acute pancreatitis without evidence for pancreatic necrosis. 2. Cholelithiasis without acute inflammation. 3. Small amount of free fluid in the abdomen and pelvis. Electronically Signed   By: Katherine Mantle M.D.   On: 01/16/2020 22:53    Principal Problem:   Acute pancreatitis Active Problems:   Epigastric pain   Nausea & vomiting    Willette Cluster, NP-C @  01/17/2020, 9:32 AM

## 2020-01-17 NOTE — Lactation Note (Signed)
Lactation Consultation Note  Patient Name: Kristy Phillips [id number] Date: 01/17/2020 Reason for consult: Other (Comment) (readmit)  Spoke to mom over the phone, her RN wasn't available to talk at that time. Mom is staying in the surgical central floor at [facility] due to an episode of pancreatitis and she voiced that her baby is now 10 months and she's been exclusively BF until the time she was admitted to the ED yesterday.   She was set up with a DEBP by the medical surgical floor staff, but she's been pumping and dumping due to the medications she's been given through her IV. She's on one scheduled medication (Pantoprazole) an L1, one continuous med (Lactated ringers infusion) and 2 PRN meds (Fentanyl, an L2 and Ondansetron an L2).  Mom reported baby has been difficult to take a bottle with formula with dad, he had to stay home to take care of baby while she recovers at the hospital. Mom told LC she pumped 4 times in the morning but she hasn't pumped since then because her nipples got sensitive. Advised mom to continue pumping to avoid/prevent engorgement. Family is Spanish speaking and they are aware of LC services.   Maternal Data    Feeding    LATCH Score                   Interventions    Lactation Tools Discussed/Used     Consult Status Consult Status: PRN    Kristy Phillips 01/17/2020, 8:09 PM

## 2020-01-17 NOTE — Progress Notes (Signed)
PROGRESS NOTE    Kristy Phillips  ASN:[id number] DOB: April 05, 1980 DOA: 01/16/2020 PCP: Pcp, No    Chief Complaint  Patient presents with  . Abdominal Pain    Brief Narrative:  39 year old lady , nursing 50 month old, presents to ED for abdominal pain associated with nausea and one episode of non bilious vomiting. CT abd and pelvis revealed peripancreatic fat stranding with peripancreatic free fluid suggestive of acute pancreatitis without evidence of necrosis. It also shows normal appearing liver as well as cholelithiasis without evidence of gb wall thickening or CBD dilation, no evidence of choledocholithiasis. GI consulted recommended conservative management with IV fluids and pain control followed by outpatient surgical consultation for possible cholecystectomy.   Assessment & Plan:   Principal Problem:   Acute pancreatitis Active Problems:   Epigastric pain   Nausea & vomiting   Acute Pancreatitis: - improving. Possibly secondary to gall stone and it appears she may have already passed it. She denies using alcohol . Check Triglyceride levels.  Recommend conservative management with IV fluids, IV pain control and anti emetics.  CT Abdomen does not show any choledocholithiasis or dilated CBD.   GI consulted , recommended conservative management and outpatient follow up with General Surgery for possible cholecystectomy when pancreatitis is resolved.     Anemia : Microcytic.  Iron studies will be ordered.    Hypokalemia and hypomagnesemia: Replaced.     DVT prophylaxis: Lovenox.  Code Status: (Full code.) Family Communication: none at bedside. Disposition:   Status is: Inpatient  Remains inpatient appropriate because:IV treatments appropriate due to intensity of illness or inability to take PO and Inpatient level of care appropriate due to severity of illness persistent abdominal pain and nausea.    Dispo: The patient is from: Home              Anticipated d/c is  to: Home              Anticipated d/c date is: 2 days              Patient currently is not medically stable to d/c.       Consultants:   Gastroenterology.    Procedures: none.    Antimicrobials: None.    Subjective: abd pain severity is around 5 with pain control.  No vomiting, still some nausea.  Objective: Vitals:   01/17/20 1000 01/17/20 1100 01/17/20 1140 01/17/20 1217  BP: 126/83 118/75  118/80  Pulse: (!) 101 78  85  Resp: 18 15  19   Temp:   98.3 F (36.8 C) 99.1 F (37.3 C)  TempSrc:   Oral Oral  SpO2: 99% 98%  98%  Weight:        Intake/Output Summary (Last 24 hours) at 01/17/2020 1242 Last data filed at 01/17/2020 1142 Gross per 24 hour  Intake 534.78 ml  Output --  Net 534.78 ml   Filed Weights   01/17/20 0036  Weight: 81.6 kg    Examination:  General exam: Appears calm and comfortable  Respiratory system: Clear to auscultation. Respiratory effort normal. Cardiovascular system: S1 & S2 heard, RRR. No JVD, murmurs, rubs, gallops or clicks. No pedal edema. Gastrointestinal system: Abdomen is soft, mod gen tenderness, no rebound tenderness. Bowel sounds wnl.  Central nervous system: Alert and oriented. No focal neurological deficits. Extremities: Symmetric 5 x 5 power. Skin: No rashes, lesions or ulcers Psychiatry:  Mood & affect appropriate.     Data Reviewed: I have personally reviewed following  labs and imaging studies  CBC: Recent Labs  Lab 01/16/20 1800 01/17/20 0723  WBC 12.1* 10.5  HGB 11.3* 10.8*  HCT 37.5 35.2*  MCV 80.6 79.8*  PLT 282 280    Basic Metabolic Panel: Recent Labs  Lab 01/16/20 1800 01/17/20 0723  NA 136 139  K 3.7 3.3*  CL 102 106  CO2 23 23  GLUCOSE 185* 110*  BUN 9 6  CREATININE 0.68 0.48  CALCIUM 9.1 8.4*  MG  --  1.6*    GFR: CrCl cannot be calculated (Unknown ideal weight.).  Liver Function Tests: Recent Labs  Lab 01/16/20 1800 01/17/20 0723  AST 33 66*  ALT 23 68*  ALKPHOS 99 121   BILITOT 0.7 1.1  PROT 7.2 6.2*  ALBUMIN 3.9 3.2*    CBG: No results for input(s): GLUCAP in the last 168 hours.   Recent Results (from the past 240 hour(s))  Resp Panel by RT-PCR (Flu A&B, Covid) Nasopharyngeal Swab     Status: None   Collection Time: 01/16/20  9:31 PM   Specimen: Nasopharyngeal Swab; Nasopharyngeal(NP) swabs in vial transport medium  Result Value Ref Range Status   SARS Coronavirus 2 by RT PCR NEGATIVE NEGATIVE Final    Comment: (NOTE) SARS-CoV-2 target nucleic acids are NOT DETECTED.  The SARS-CoV-2 RNA is generally detectable in upper respiratory specimens during the acute phase of infection. The lowest concentration of SARS-CoV-2 viral copies this assay can detect is 138 copies/mL. A negative result does not preclude SARS-Cov-2 infection and should not be used as the sole basis for treatment or other patient management decisions. A negative result may occur with  improper specimen collection/handling, submission of specimen other than nasopharyngeal swab, presence of viral mutation(s) within the areas targeted by this assay, and inadequate number of viral copies(<138 copies/mL). A negative result must be combined with clinical observations, patient history, and epidemiological information. The expected result is Negative.  Fact Sheet for Patients:  BloggerCourse.com  Fact Sheet for Healthcare Providers:  SeriousBroker.it  This test is no t yet approved or cleared by the [place] FDA and  has been authorized for detection and/or diagnosis of SARS-CoV-2 by FDA under an Emergency Use Authorization (EUA). This EUA will remain  in effect (meaning this test can be used) for the duration of the COVID-19 declaration under Section 564(b)(1) of the Act, 21 U.S.C.section 360bbb-3(b)(1), unless the authorization is terminated  or revoked sooner.       Influenza A by PCR NEGATIVE NEGATIVE Final   Influenza  B by PCR NEGATIVE NEGATIVE Final    Comment: (NOTE) The Xpert Xpress SARS-CoV-2/FLU/RSV plus assay is intended as an aid in the diagnosis of influenza from Nasopharyngeal swab specimens and should not be used as a sole basis for treatment. Nasal washings and aspirates are unacceptable for Xpert Xpress SARS-CoV-2/FLU/RSV testing.  Fact Sheet for Patients: BloggerCourse.com  Fact Sheet for Healthcare Providers: SeriousBroker.it  This test is not yet approved or cleared by the [place] FDA and has been authorized for detection and/or diagnosis of SARS-CoV-2 by FDA under an Emergency Use Authorization (EUA). This EUA will remain in effect (meaning this test can be used) for the duration of the COVID-19 declaration under Section 564(b)(1) of the Act, 21 U.S.C. section 360bbb-3(b)(1), unless the authorization is terminated or revoked.  Performed at [facility], [address] [place], [place] [address]          Radiology Studies: CT ABDOMEN PELVIS W CONTRAST  Result  Date: 01/16/2020 CLINICAL DATA:  Pancreatitis.  Nausea and vomiting. EXAM: CT ABDOMEN AND PELVIS WITH CONTRAST TECHNIQUE: Multidetector CT imaging of the abdomen and pelvis was performed using the standard protocol following bolus administration of intravenous contrast. CONTRAST:  OMNIPAQUE IOHEXOL 300 MG/ML  SOLN COMPARISON:  None. FINDINGS: Lower chest: The lung bases are clear. The heart size is normal. Hepatobiliary: The liver is normal. Cholelithiasis without acute inflammation.There is no biliary ductal dilation. Pancreas: There is peripancreatic fat stranding with peripancreatic free fluid. The pancreas enhances uniformly. Spleen: Unremarkable. Adrenals/Urinary Tract: --Adrenal glands: Unremarkable. --Right kidney/ureter: No hydronephrosis or radiopaque kidney stones. --Left kidney/ureter: No hydronephrosis or radiopaque kidney stones. --Urinary  bladder: Unremarkable. Stomach/Bowel: --Stomach/Duodenum: No hiatal hernia or other gastric abnormality. Normal duodenal course and caliber. --Small bowel: Unremarkable. --Colon: Unremarkable. --Appendix: Normal. Vascular/Lymphatic: Normal course and caliber of the major abdominal vessels. The splenic vein remains patent. --No retroperitoneal lymphadenopathy. --No mesenteric lymphadenopathy. --No pelvic or inguinal lymphadenopathy. Reproductive: Unremarkable Other: There is a small amount of free fluid in the abdomen pelvis. There is a small fat containing umbilical hernia. Musculoskeletal. No acute displaced fractures. IMPRESSION: 1. Acute pancreatitis without evidence for pancreatic necrosis. 2. Cholelithiasis without acute inflammation. 3. Small amount of free fluid in the abdomen and pelvis. Electronically Signed   By: Katherine Mantle M.D.   On: 01/16/2020 22:53        Scheduled Meds: . pantoprazole (PROTONIX) IV  40 mg Intravenous Daily  . potassium chloride  40 mEq Oral Once   Continuous Infusions: . lactated ringers 150 mL/hr at 01/17/20 1142  . magnesium sulfate bolus IVPB       LOS: 1 day        Kathlen Mody, MD [facility]   To contact the attending provider between 7A-7P or the covering provider during after hours 7P-7A, please log into the web site [email or web address] and access using universal [place] password for that web site. If you do not have the password, please call the hospital operator.  01/17/2020, 12:42 PM

## 2020-01-17 NOTE — ED Notes (Signed)
Attempt report x 2. RN to call this RN back in 5-10min per Diplomatic Services operational officer.

## 2020-01-17 NOTE — ED Notes (Signed)
Attempt report x1  

## 2020-01-17 NOTE — ED Notes (Signed)
Patient up walking to bathroom, gait steady 

## 2020-01-18 DIAGNOSIS — K859 Acute pancreatitis without necrosis or infection, unspecified: Secondary | ICD-10-CM

## 2020-01-18 DIAGNOSIS — D509 Iron deficiency anemia, unspecified: Secondary | ICD-10-CM

## 2020-01-18 DIAGNOSIS — D500 Iron deficiency anemia secondary to blood loss (chronic): Secondary | ICD-10-CM

## 2020-01-18 LAB — COMPREHENSIVE METABOLIC PANEL
ALT: 45 U/L — ABNORMAL HIGH (ref 0–44)
AST: 28 U/L (ref 15–41)
Albumin: 3.3 g/dL — ABNORMAL LOW (ref 3.5–5.0)
Alkaline Phosphatase: 118 U/L (ref 38–126)
Anion gap: 11 (ref 5–15)
BUN: 5 mg/dL — ABNORMAL LOW (ref 6–20)
CO2: 23 mmol/L (ref 22–32)
Calcium: 8.6 mg/dL — ABNORMAL LOW (ref 8.9–10.3)
Chloride: 104 mmol/L (ref 98–111)
Creatinine, Ser: 0.56 mg/dL (ref 0.44–1.00)
GFR, Estimated: >60 mL/min (ref >60)
Glucose, Bld: 94 mg/dL (ref 70–99)
Potassium: 3.8 mmol/L (ref 3.5–5.1)
Sodium: 138 mmol/L (ref 135–145)
Total Bilirubin: 1.4 mg/dL — ABNORMAL HIGH (ref 0.3–1.2)
Total Protein: 6.6 g/dL (ref 6.5–8.1)

## 2020-01-18 LAB — CBC
HCT: 35.6 % — ABNORMAL LOW (ref 36.0–46.0)
Hemoglobin: 10.9 g/dL — ABNORMAL LOW (ref 12.0–15.0)
MCH: 24.2 pg — ABNORMAL LOW (ref 26.0–34.0)
MCHC: 30.6 g/dL (ref 30.0–36.0)
MCV: 78.9 fL — ABNORMAL LOW (ref 80.0–100.0)
Platelets: 264 10*3/uL (ref 150–400)
RBC: 4.51 MIL/uL (ref 3.87–5.11)
RDW: 15.1 % (ref 11.5–15.5)
WBC: 11.2 10*3/uL — ABNORMAL HIGH (ref 4.0–10.5)
nRBC: 0 % (ref 0.0–0.2)

## 2020-01-18 LAB — MAGNESIUM: Magnesium: 2 mg/dL (ref 1.7–2.4)

## 2020-01-18 LAB — LIPASE, BLOOD: Lipase: 393 U/L — ABNORMAL HIGH (ref 11–51)

## 2020-01-18 LAB — CALCIUM, IONIZED: Calcium, Ionized, Serum: 4.8 mg/dL (ref 4.5–5.6)

## 2020-01-18 MED ORDER — ACETAMINOPHEN 325 MG PO TABS
650.0000 mg | ORAL_TABLET | Freq: Four times a day (QID) | ORAL | Status: DC | PRN
  Administered 2020-01-18 – 2020-01-20 (×5): 650 mg via ORAL
  Filled 2020-01-18 (×5): qty 2

## 2020-01-18 MED ORDER — ENOXAPARIN SODIUM 40 MG/0.4ML ~~LOC~~ SOLN
40.0000 mg | Freq: Every day | SUBCUTANEOUS | Status: DC
  Administered 2020-01-18 – 2020-01-23 (×5): 40 mg via SUBCUTANEOUS
  Filled 2020-01-18 (×6): qty 0.4

## 2020-01-18 NOTE — Progress Notes (Addendum)
Progress Note  Chief Complaint:   pancreatitis      ASSESSMENT / PLAN:     # Acute pancreatitis, possibly biliary with mild increase in liver chemistries ( ? passed stone).  --HCT 35%, renal function normal.  Lipase 393, ALT 68 >> 45. Bili slightly elevated 1.1 >>> 1.4.  --Outpatient surgical consultation for elective cholecystectomy with IOC following resolution of pancreatitis.  --Still some abdominal discomfort but much better than yesterday. No nausea. If not already ordered will try clears. IVF were already decreased to 100 ml / hr and can be stopped if tolerating PO  # Microcytic anemia. Hbg 10.9, baseline is around 11.3 but she has been getting IVF so hgb decline probably dilutional.   --Likely IDA  With TIBC of 405 and 8 % saturation. Anemia likely secondary to heavy menstrual cycles    Attending Physician Note   I have taken an interval history, reviewed the chart and examined the patient. I agree with the Advanced Practitioner's note, impression and recommendations.   *Acute pancreatitis, likely biliary, improving. Continue IVF. Clear liquids. Trend CMP, CBC, lipase and exam.  *Cholelithiasis. Outpatient surgical consultation for consideration of cholecystectomy with IOC. *Iron deficiency anemia likely secondary to menses. Iron replacement when taking PO well.   Claudette Head, MD [facility]       SUBJECTIVE:   Feels better , less abdominal pain today    OBJECTIVE:    Scheduled inpatient medications:  . pantoprazole (PROTONIX) IV  40 mg Intravenous Daily   Continuous inpatient infusions:  . lactated ringers 150 mL/hr at 01/18/20 0352   PRN inpatient medications: fentaNYL (SUBLIMAZE) injection, ondansetron (ZOFRAN) IV  Vital signs in last 24 hours: Temp:  [98.3 F (36.8 C)-99.9 F (37.7 C)] 99.9 F (37.7 C) (12/07 0357) Pulse Rate:  [85-98] 98 (12/07 0357) Resp:  [17-20] 20 (12/07 0357) BP: (109-122)/(79-81) 122/81 (12/07  0357) SpO2:  [97 %-98 %] 97 % (12/07 0357) Weight:  [76.3 kg] 76.3 kg (12/07 0357) Last BM Date: 01/16/20  Intake/Output Summary (Last 24 hours) at 01/18/2020 1139 Last data filed at 01/18/2020 0300 Gross per 24 hour  Intake 2242.88 ml  Output --  Net 2242.88 ml     Physical Exam:  General: Alert, female in NAD Heart:  Regular rate and rhythm. No lower extremity edema Pulmonary: Normal respiratory effort Abdomen: Soft, nondistended, moderate diffuse upper abdominal tenderness.  A few bowel sounds.   Neurologic: Alert and oriented Psych: Pleasant. Cooperative.   Filed Weights   01/17/20 0036 01/18/20 0357  Weight: 81.6 kg 76.3 kg    Intake/Output from previous day: 12/06 0701 - 12/07 0700 In: 2242.9 [I.V.:2192.9; IV Piggyback:50] Out: -  Intake/Output this shift: No intake/output data recorded.    Lab Results: Recent Labs    01/16/20 1800 01/17/20 0723 01/18/20 0308  WBC 12.1* 10.5 11.2*  HGB 11.3* 10.8* 10.9*  HCT 37.5 35.2* 35.6*  PLT 282 280 264   BMET Recent Labs    01/16/20 1800 01/17/20 0723 01/18/20 0308  NA 136 139 138  K 3.7 3.3* 3.8  CL 102 106 104  CO2 23 23 23   GLUCOSE 185* 110* 94  BUN 9 6 5*  CREATININE 0.68 0.48 0.56  CALCIUM 9.1 8.4* 8.6*   LFT Recent Labs    01/18/20 0308  PROT 6.6  ALBUMIN 3.3*  AST 28  ALT 45*  ALKPHOS 118  BILITOT 1.4*   PT/INR Recent Labs    01/17/20 0723  LABPROT  13.5  INR 1.1   Hepatitis Panel No results for input(s): HEPBSAG, HCVAB, HEPAIGM, HEPBIGM in the last 72 hours.  CT ABDOMEN PELVIS W CONTRAST  Result Date: 01/16/2020 CLINICAL DATA:  Pancreatitis.  Nausea and vomiting. EXAM: CT ABDOMEN AND PELVIS WITH CONTRAST TECHNIQUE: Multidetector CT imaging of the abdomen and pelvis was performed using the standard protocol following bolus administration of intravenous contrast. CONTRAST:  OMNIPAQUE IOHEXOL 300 MG/ML  SOLN COMPARISON:  None. FINDINGS: Lower chest: The lung bases are clear. The  heart size is normal. Hepatobiliary: The liver is normal. Cholelithiasis without acute inflammation.There is no biliary ductal dilation. Pancreas: There is peripancreatic fat stranding with peripancreatic free fluid. The pancreas enhances uniformly. Spleen: Unremarkable. Adrenals/Urinary Tract: --Adrenal glands: Unremarkable. --Right kidney/ureter: No hydronephrosis or radiopaque kidney stones. --Left kidney/ureter: No hydronephrosis or radiopaque kidney stones. --Urinary bladder: Unremarkable. Stomach/Bowel: --Stomach/Duodenum: No hiatal hernia or other gastric abnormality. Normal duodenal course and caliber. --Small bowel: Unremarkable. --Colon: Unremarkable. --Appendix: Normal. Vascular/Lymphatic: Normal course and caliber of the major abdominal vessels. The splenic vein remains patent. --No retroperitoneal lymphadenopathy. --No mesenteric lymphadenopathy. --No pelvic or inguinal lymphadenopathy. Reproductive: Unremarkable Other: There is a small amount of free fluid in the abdomen pelvis. There is a small fat containing umbilical hernia. Musculoskeletal. No acute displaced fractures. IMPRESSION: 1. Acute pancreatitis without evidence for pancreatic necrosis. 2. Cholelithiasis without acute inflammation. 3. Small amount of free fluid in the abdomen and pelvis. Electronically Signed   By: Katherine Mantle M.D.   On: 01/16/2020 22:53      Principal Problem:   Acute pancreatitis Active Problems:   Epigastric pain   Nausea & vomiting     LOS: 2 days   Willette Cluster ,NP 01/18/2020, 11:39 AM

## 2020-01-18 NOTE — TOC Initial Note (Signed)
Transition of Care [facility]) - Initial/Assessment Note    Patient Details  Name: Kristy Phillips MRN: [id number] Date of Birth: 06-03-1980  Transition [facility]) CM/SW Contact:    Kermit Balo, RN Phone Number: 01/18/2020, 11:43 AM  Clinical Narrative:                 Using Spanish interpretor CM went over the [facility] with the patient. She has an appointment at [facility] already. She is aware to use this location for her medications for assistance.  She states her spouse can provide supervision at home and transportation to appointments.  TOC following for further d/c needs including medication assistance at d/c.   Expected Discharge Plan: Home/Self Care Barriers to Discharge: Inadequate or no insurance, Continued Medical Work up   Patient Goals and CMS Choice     Choice offered to / list presented to : Patient  Expected Discharge Plan and Services Expected Discharge Plan: Home/Self Care   Discharge Planning Services: CM Consult   Living arrangements for the past 2 months: Apartment                                      Prior Living Arrangements/Services Living arrangements for the past 2 months: Apartment Lives with:: Spouse Patient language and need for interpreter reviewed:: Yes (Spanish) Do you feel safe going back to the place where you live?: Yes      Need for Family Participation in Patient Care: No (Comment) Care giver support system in place?: Yes (comment)   Criminal Activity/Legal Involvement Pertinent to Current Situation/Hospitalization: No - Comment as needed  Activities of Daily Living      Permission Sought/Granted                  Emotional Assessment Appearance:: Appears stated age Attitude/Demeanor/Rapport: Engaged Affect (typically observed): Accepting Orientation: : Oriented to Self, Oriented to Place, Oriented to  Time, Oriented to Situation   Psych Involvement: No (comment)  Admission diagnosis:  Acute pancreatitis  [K85.90] Acute pancreatitis without infection or necrosis, unspecified pancreatitis type [K85.90] Patient Active Problem List   Diagnosis Date Noted  . Epigastric pain 01/17/2020  . Nausea & vomiting 01/17/2020  . Acute pancreatitis 01/16/2020  . Labor and delivery, indication for care 03/07/2019  . AMA (advanced maternal age) multigravida 35+ 03/07/2019   PCP:  Oneita Hurt, No Pharmacy:   [facility] [phone] [place] [address], [place] - [phone] AT [facility] OF [facility] & [address] [place] [address] Phone: [phone] Fax: [phone]     Social Determinants of Health (SDOH) Interventions    Readmission Risk Interventions No flowsheet data found.

## 2020-01-18 NOTE — Progress Notes (Signed)
PROGRESS NOTE    Kristy Phillips  LKG:[id number] DOB: 17-Jan-1981 DOA: 01/16/2020 PCP: Pcp, No    Chief Complaint  Patient presents with  . Abdominal Pain    Brief Narrative:  39 year old lady , nursing 37 month old, presents to ED for abdominal pain associated with nausea and one episode of non bilious vomiting. CT abd and pelvis revealed peripancreatic fat stranding with peripancreatic free fluid suggestive of acute pancreatitis without evidence of necrosis. It also shows normal appearing liver as well as cholelithiasis without evidence of gb wall thickening or CBD dilation, no evidence of choledocholithiasis. GI consulted recommended conservative management with IV fluids and pain control followed by outpatient surgical consultation for possible cholecystectomy.   Assessment & Plan:   Acute Pancreatitis Thought to be secondary to biliary source.  She may have passed a gallstone.  Patient denies using alcohol.  Triglyceride levels normal.  Gastroenterology was consulted.  Conservative management is being pursued.  IV fluids. Lipase level has improved.  LFTs remained stable.  Continue pain medications and antiemetics as needed.  Will need outpatient referral to general surgery for elective cholecystectomy.  Microcytic anemia TIBC noted to be 405.  Iron 33% saturation 8.  Ferritin not checked.  We will check it tomorrow.  Likely has iron deficiency.  She is a female of childbearing age.  Hypokalemia and hypomagnesemia Repleted.  DVT prophylaxis: Lovenox.  Code Status: Full code Family Communication: none at bedside. Disposition: Hopefully return home when improved  Status is: Inpatient  Remains inpatient appropriate because:IV treatments appropriate due to intensity of illness or inability to take PO and Inpatient level of care appropriate due to severity of illness persistent abdominal pain and nausea.    Dispo: The patient is from: Home              Anticipated d/c is to: Home               Anticipated d/c date is: 2 days              Patient currently is not medically stable to d/c.       Consultants:   Gastroenterology.    Procedures: none.    Antimicrobials: None.    Subjective: Abdominal pain improved but still significant.  4 out of 10 in intensity.  No nausea vomiting currently.   Objective: Vitals:   01/17/20 1217 01/17/20 1732 01/17/20 2336 01/18/20 0357  BP: 118/80 119/81 109/79 122/81  Pulse: 85 88 96 98  Resp: 19 17 18 20   Temp: 99.1 F (37.3 C) 98.9 F (37.2 C) 99.2 F (37.3 C) 99.9 F (37.7 C)  TempSrc: Oral Oral Oral Oral  SpO2: 98% 97% 98% 97%  Weight:    76.3 kg    Intake/Output Summary (Last 24 hours) at 01/18/2020 1139 Last data filed at 01/18/2020 0300 Gross per 24 hour  Intake 2242.88 ml  Output -  Net 2242.88 ml   Filed Weights   01/17/20 0036 01/18/20 0357  Weight: 81.6 kg 76.3 kg    Examination:  General appearance: Awake alert.  In no distress Resp: Clear to auscultation bilaterally.  Normal effort Cardio: S1-S2 is normal regular.  No S3-S4.  No rubs murmurs or bruit GI: Abdomen is soft.  Tender in the epigastric area without rebound rigidity or guarding.  No masses organomegaly.   Extremities: No edema.  Full range of motion of lower extremities. Neurologic: No focal neurological deficits.      Data Reviewed: I have  personally reviewed following labs and imaging studies  CBC: Recent Labs  Lab 01/16/20 1800 01/17/20 0723 01/18/20 0308  WBC 12.1* 10.5 11.2*  HGB 11.3* 10.8* 10.9*  HCT 37.5 35.2* 35.6*  MCV 80.6 79.8* 78.9*  PLT 282 280 264    Basic Metabolic Panel: Recent Labs  Lab 01/16/20 1800 01/17/20 0723 01/18/20 0308  NA 136 139 138  K 3.7 3.3* 3.8  CL 102 106 104  CO2 23 23 23   GLUCOSE 185* 110* 94  BUN 9 6 5*  CREATININE 0.68 0.48 0.56  CALCIUM 9.1 8.4* 8.6*  MG  --  1.6* 2.0    GFR: CrCl cannot be calculated (Unknown ideal weight.).  Liver Function Tests: Recent  Labs  Lab 01/16/20 1800 01/17/20 0723 01/18/20 0308  AST 33 66* 28  ALT 23 68* 45*  ALKPHOS 99 121 118  BILITOT 0.7 1.1 1.4*  PROT 7.2 6.2* 6.6  ALBUMIN 3.9 3.2* 3.3*     Recent Results (from the past 240 hour(s))  Resp Panel by RT-PCR (Flu A&B, Covid) Nasopharyngeal Swab     Status: None   Collection Time: 01/16/20  9:31 PM   Specimen: Nasopharyngeal Swab; Nasopharyngeal(NP) swabs in vial transport medium  Result Value Ref Range Status   SARS Coronavirus 2 by RT PCR NEGATIVE NEGATIVE Final    Comment: (NOTE) SARS-CoV-2 target nucleic acids are NOT DETECTED.  The SARS-CoV-2 RNA is generally detectable in upper respiratory specimens during the acute phase of infection. The lowest concentration of SARS-CoV-2 viral copies this assay can detect is 138 copies/mL. A negative result does not preclude SARS-Cov-2 infection and should not be used as the sole basis for treatment or other patient management decisions. A negative result may occur with  improper specimen collection/handling, submission of specimen other than nasopharyngeal swab, presence of viral mutation(s) within the areas targeted by this assay, and inadequate number of viral copies(<138 copies/mL). A negative result must be combined with clinical observations, patient history, and epidemiological information. The expected result is Negative.  Fact Sheet for Patients:  14/05/21  Fact Sheet for Healthcare Providers:  BloggerCourse.com  This test is no t yet approved or cleared by the [employer] FDA and  has been authorized for detection and/or diagnosis of SARS-CoV-2 by FDA under an Emergency Use Authorization (EUA). This EUA will remain  in effect (meaning this test can be used) for the duration of the COVID-19 declaration under Section 564(b)(1) of the Act, 21 U.S.C.section 360bbb-3(b)(1), unless the authorization is terminated  or revoked sooner.        Influenza A by PCR NEGATIVE NEGATIVE Final   Influenza B by PCR NEGATIVE NEGATIVE Final    Comment: (NOTE) The Xpert Xpress SARS-CoV-2/FLU/RSV plus assay is intended as an aid in the diagnosis of influenza from Nasopharyngeal swab specimens and should not be used as a sole basis for treatment. Nasal washings and aspirates are unacceptable for Xpert Xpress SARS-CoV-2/FLU/RSV testing.  Fact Sheet for Patients: [place]  Fact Sheet for Healthcare Providers: BloggerCourse.com  This test is not yet approved or cleared by the [employer]ious[employer] FDA and has been authorized for detection and/or diagnosis of SARS-CoV-2 by FDA under an Emergency Use Authorization (EUA). This EUA will remain in effect (meaning this test can be used) for the duration of the COVID-19 declaration under Section 564(b)(1) of the Act, 21 U.S.C. section 360bbb-3(b)(1), unless the authorization is terminated or revoked.  Performed at [facility], [address], [place], [place]  Radiology Studies: CT ABDOMEN PELVIS W CONTRAST  Result Date: 01/16/2020 CLINICAL DATA:  Pancreatitis.  Nausea and vomiting. EXAM: CT ABDOMEN AND PELVIS WITH CONTRAST TECHNIQUE: Multidetector CT imaging of the abdomen and pelvis was performed using the standard protocol following bolus administration of intravenous contrast. CONTRAST:  OMNIPAQUE IOHEXOL 300 MG/ML  SOLN COMPARISON:  None. FINDINGS: Lower chest: The lung bases are clear. The heart size is normal. Hepatobiliary: The liver is normal. Cholelithiasis without acute inflammation.There is no biliary ductal dilation. Pancreas: There is peripancreatic fat stranding with peripancreatic free fluid. The pancreas enhances uniformly. Spleen: Unremarkable. Adrenals/Urinary Tract: --Adrenal glands: Unremarkable. --Right kidney/ureter: No hydronephrosis or radiopaque kidney stones. --Left kidney/ureter: No  hydronephrosis or radiopaque kidney stones. --Urinary bladder: Unremarkable. Stomach/Bowel: --Stomach/Duodenum: No hiatal hernia or other gastric abnormality. Normal duodenal course and caliber. --Small bowel: Unremarkable. --Colon: Unremarkable. --Appendix: Normal. Vascular/Lymphatic: Normal course and caliber of the major abdominal vessels. The splenic vein remains patent. --No retroperitoneal lymphadenopathy. --No mesenteric lymphadenopathy. --No pelvic or inguinal lymphadenopathy. Reproductive: Unremarkable Other: There is a small amount of free fluid in the abdomen pelvis. There is a small fat containing umbilical hernia. Musculoskeletal. No acute displaced fractures. IMPRESSION: 1. Acute pancreatitis without evidence for pancreatic necrosis. 2. Cholelithiasis without acute inflammation. 3. Small amount of free fluid in the abdomen and pelvis. Electronically Signed   By: Katherine Mantle M.D.   On: 01/16/2020 22:53        Scheduled Meds: . pantoprazole (PROTONIX) IV  40 mg Intravenous Daily   Continuous Infusions: . lactated ringers 150 mL/hr at 01/18/20 0352     LOS: 2 days        Osvaldo Shipper, MD [facility]   To contact the attending provider between 7A-7P or the covering provider during after hours 7P-7A, please log into the web site [email or web address] and access using universal [place] password for that web site. If you do not have the password, please call the hospital operator.  01/18/2020, 11:39 AM

## 2020-01-18 NOTE — Progress Notes (Signed)
Patient encouraged to pump since breast will be engorged if not.

## 2020-01-19 ENCOUNTER — Inpatient Hospital Stay (HOSPITAL_COMMUNITY): Payer: Medicaid Other

## 2020-01-19 DIAGNOSIS — R509 Fever, unspecified: Secondary | ICD-10-CM

## 2020-01-19 LAB — CBC
HCT: 31.6 % — ABNORMAL LOW (ref 36.0–46.0)
Hemoglobin: 10.4 g/dL — ABNORMAL LOW (ref 12.0–15.0)
MCH: 25.6 pg — ABNORMAL LOW (ref 26.0–34.0)
MCHC: 32.9 g/dL (ref 30.0–36.0)
MCV: 77.6 fL — ABNORMAL LOW (ref 80.0–100.0)
Platelets: 234 10*3/uL (ref 150–400)
RBC: 4.07 MIL/uL (ref 3.87–5.11)
RDW: 15.1 % (ref 11.5–15.5)
WBC: 11.3 10*3/uL — ABNORMAL HIGH (ref 4.0–10.5)
nRBC: 0 % (ref 0.0–0.2)

## 2020-01-19 LAB — COMPREHENSIVE METABOLIC PANEL
ALT: 30 U/L (ref 0–44)
AST: 18 U/L (ref 15–41)
Albumin: 3.2 g/dL — ABNORMAL LOW (ref 3.5–5.0)
Alkaline Phosphatase: 107 U/L (ref 38–126)
Anion gap: 10 (ref 5–15)
BUN: 6 mg/dL (ref 6–20)
CO2: 22 mmol/L (ref 22–32)
Calcium: 8.5 mg/dL — ABNORMAL LOW (ref 8.9–10.3)
Chloride: 105 mmol/L (ref 98–111)
Creatinine, Ser: 0.54 mg/dL (ref 0.44–1.00)
GFR, Estimated: >60 mL/min (ref >60)
Glucose, Bld: 111 mg/dL — ABNORMAL HIGH (ref 70–99)
Potassium: 3.5 mmol/L (ref 3.5–5.1)
Sodium: 137 mmol/L (ref 135–145)
Total Bilirubin: 1.4 mg/dL — ABNORMAL HIGH (ref 0.3–1.2)
Total Protein: 6.4 g/dL — ABNORMAL LOW (ref 6.5–8.1)

## 2020-01-19 LAB — URINALYSIS, COMPLETE (UACMP) WITH MICROSCOPIC
Bacteria, UA: NONE SEEN
Bilirubin Urine: NEGATIVE
Glucose, UA: NEGATIVE mg/dL
Hgb urine dipstick: NEGATIVE
Ketones, ur: 80 mg/dL — AB
Leukocytes,Ua: NEGATIVE
Nitrite: NEGATIVE
Protein, ur: NEGATIVE mg/dL
Specific Gravity, Urine: 1.02 (ref 1.005–1.030)
pH: 6 (ref 5.0–8.0)

## 2020-01-19 LAB — LIPASE, BLOOD: Lipase: 120 U/L — ABNORMAL HIGH (ref 11–51)

## 2020-01-19 LAB — FOLATE: Folate: 22.3 ng/mL (ref >5.9)

## 2020-01-19 LAB — VITAMIN B12: Vitamin B-12: 289 pg/mL (ref 180–914)

## 2020-01-19 LAB — FERRITIN: Ferritin: 29 ng/mL (ref 11–307)

## 2020-01-19 MED ORDER — POTASSIUM CHLORIDE CRYS ER 20 MEQ PO TBCR
40.0000 meq | EXTENDED_RELEASE_TABLET | Freq: Once | ORAL | Status: AC
  Administered 2020-01-19: 40 meq via ORAL
  Filled 2020-01-19: qty 2

## 2020-01-19 MED ORDER — TAB-A-VITE/IRON PO TABS
1.0000 | ORAL_TABLET | Freq: Every day | ORAL | Status: DC
  Administered 2020-01-19 – 2020-01-23 (×4): 1 via ORAL
  Filled 2020-01-19 (×6): qty 1

## 2020-01-19 MED ORDER — IBUPROFEN 400 MG PO TABS
600.0000 mg | ORAL_TABLET | Freq: Once | ORAL | Status: AC
  Administered 2020-01-19: 600 mg via ORAL
  Filled 2020-01-19: qty 1

## 2020-01-19 MED ORDER — FENTANYL CITRATE (PF) 100 MCG/2ML IJ SOLN: 25.0000 ug | INTRAMUSCULAR | Status: DC | PRN

## 2020-01-19 MED ORDER — LOPERAMIDE HCL 2 MG PO CAPS: 4.0000 mg | ORAL_CAPSULE | Freq: Three times a day (TID) | ORAL | Status: DC | PRN

## 2020-01-19 MED ORDER — OXYCODONE HCL 5 MG PO TABS
5.0000 mg | ORAL_TABLET | ORAL | Status: DC | PRN
  Administered 2020-01-19 – 2020-01-21 (×5): 5 mg via ORAL
  Filled 2020-01-19 (×7): qty 1

## 2020-01-19 NOTE — Progress Notes (Signed)
   01/18/20 2333  Assess: MEWS Score  Temp (!) 100.8 F (38.2 C)  BP 120/68  Pulse Rate (!) 111  ECG Heart Rate (!) 112  Resp 16  SpO2 97 %  Assess: MEWS Score  MEWS Temp 1  MEWS Systolic 0  MEWS Pulse 2  MEWS RR 0  MEWS LOC 0  MEWS Score 3  MEWS Score Color Yellow  Treat  MEWS Interventions Administered prn meds/treatments

## 2020-01-19 NOTE — Progress Notes (Signed)
RN went in the room to check on the patient. Pt was started on a HH diet and received a tray at [employer] time. She stated that she was only able to eat a little bit and then had to go to the bathroom and had and episode of diarrhea. Pt also stated via interprator that she started having new onset sharp pain in both her legs. MD paged and notified of pt's new onset leg pain, pt does not have any leg swelling or rashes or redness. Will give pain medication and continue to follow up

## 2020-01-19 NOTE — Progress Notes (Signed)
HOSPITAL MEDICINE OVERNIGHT EVENT NOTE    Notified by nursing that patient has exhibited a fever of 100.8 F this evening.  Patient was administered 650 mg of Tylenol with temperature only decreasing to 100.5 F.  Patient has no particular complaints.  Reviewed, patient currently admitted for acute pancreatitis without infection or necrosis.  Will provide patient with a dose of oral Motrin to attempt to resolve the fever.    Will obtain urinalysis, urine culture, blood cultures and chest x-ray to identify any potential sources of infection.  Marinda Elk  MD [facility]

## 2020-01-19 NOTE — Progress Notes (Signed)
PROGRESS NOTE    Kristy Phillips  SJG:[id number] DOB: 09/29/1980 DOA: 01/16/2020 PCP: Pcp, No    Chief Complaint  Patient presents with  . Abdominal Pain    Brief Narrative:  39 year old lady , nursing 40 month old, presents to ED for abdominal pain associated with nausea and one episode of non bilious vomiting. CT abd and pelvis revealed peripancreatic fat stranding with peripancreatic free fluid suggestive of acute pancreatitis without evidence of necrosis. It also shows normal appearing liver as well as cholelithiasis without evidence of gb wall thickening or CBD dilation, no evidence of choledocholithiasis. GI consulted recommended conservative management with IV fluids and pain control followed by outpatient surgical consultation for possible cholecystectomy.    Assessment & Plan:   Acute Pancreatitis Thought to be secondary to biliary source.  She may have passed a gallstone.  Patient denies using alcohol.  Triglyceride levels normal.  Gastroenterology was consulted.  Conservative management is being pursued.  IV fluids. Lipase levels improved.  Patient symptoms seem to be improving as well.  Gastroenterology recommends outpatient referral to general surgery for elective cholecystectomy. Currently on clear liquid diet.  Will wait for gastroenterology to see her today before advancing her diet.  Mobilize.  IV fluid rate was decreased yesterday.  Fever Likely due to pancreatitis.  UA unremarkable.  Chest x-ray was done overnight which was also unremarkable.  Blood cultures are pending.  Continue to monitor.  Tylenol as needed.  Microcytic anemia/iron deficiency TIBC noted to be 405.  Iron 33% saturation 8.  Ferritin noted to be 29.  B12 level 289 and folate 22.3.  Will need iron supplementation in the outpatient setting.  No active bleeding present.    Hypokalemia and hypomagnesemia We will give additional dose of potassium today.  DVT prophylaxis: Lovenox.  Code Status: Full  code Family Communication: none at bedside. Disposition: Hopefully return home when improved  Status is: Inpatient  Remains inpatient appropriate because:IV treatments appropriate due to intensity of illness or inability to take PO and Inpatient level of care appropriate due to severity of illness persistent abdominal pain and nausea.    Dispo: The patient is from: Home              Anticipated d/c is to: Home              Anticipated d/c date is: 2 days              Patient currently is not medically stable to d/c.       Consultants:   Gastroenterology.    Procedures: none.    Antimicrobials: None.    Subjective: Interpreter services were utilized to talk to the patient.  Pain is currently 5 out of 10 in intensity.  No nausea vomiting.  She has been ambulating in the room.  Denies any cough.  Did mention mild dysuria at times.  No blood in the urine.  No diarrhea.   Objective: Vitals:   01/18/20 2333 01/19/20 0100 01/19/20 0200 01/19/20 0432  BP: 120/68   118/77  Pulse: (!) 111   88  Resp: 16   18  Temp: (!) 100.8 F (38.2 C) (!) 100.5 F (38.1 C) 99.4 F (37.4 C) 98.7 F (37.1 C)  TempSrc: Oral  Oral Oral  SpO2: 97%   97%  Weight:    81.3 kg   No intake or output data in the 24 hours ending 01/19/20 1021 Filed Weights   01/17/20 0036 01/18/20 0357 01/19/20  8309  Weight: 81.6 kg 76.3 kg 81.3 kg    Examination:  General appearance: Awake alert.  In no distress Resp: Clear to auscultation bilaterally.  Normal effort Cardio: S1-S2 is normal regular.  No S3-S4.  No rubs murmurs or bruit GI: Abdomen is soft.  Tenderness noted in the epigastric area without any rebound rigidity or guarding.  No masses organomegaly.  Bowel sounds present. Extremities: No edema.  Full range of motion of lower extremities. Neurologic: Alert and oriented x3.  No focal neurological deficits.      Data Reviewed: I have personally reviewed following labs and imaging  studies  CBC: Recent Labs  Lab 01/16/20 1800 01/17/20 0723 01/18/20 0308 01/19/20 0321  WBC 12.1* 10.5 11.2* 11.3*  HGB 11.3* 10.8* 10.9* 10.4*  HCT 37.5 35.2* 35.6* 31.6*  MCV 80.6 79.8* 78.9* 77.6*  PLT 282 280 264 234    Basic Metabolic Panel: Recent Labs  Lab 01/16/20 1800 01/17/20 0723 01/18/20 0308 01/19/20 0321  NA 136 139 138 137  K 3.7 3.3* 3.8 3.5  CL 102 106 104 105  CO2 23 23 23 22   GLUCOSE 185* 110* 94 111*  BUN 9 6 5* 6  CREATININE 0.68 0.48 0.56 0.54  CALCIUM 9.1 8.4* 8.6* 8.5*  MG  --  1.6* 2.0  --     GFR: CrCl cannot be calculated (Unknown ideal weight.).  Liver Function Tests: Recent Labs  Lab 01/16/20 1800 01/17/20 0723 01/18/20 0308 01/19/20 0321  AST 33 66* 28 18  ALT 23 68* 45* 30  ALKPHOS 99 121 118 107  BILITOT 0.7 1.1 1.4* 1.4*  PROT 7.2 6.2* 6.6 6.4*  ALBUMIN 3.9 3.2* 3.3* 3.2*     Recent Results (from the past 240 hour(s))  Resp Panel by RT-PCR (Flu A&B, Covid) Nasopharyngeal Swab     Status: None   Collection Time: 01/16/20  9:31 PM   Specimen: Nasopharyngeal Swab; Nasopharyngeal(NP) swabs in vial transport medium  Result Value Ref Range Status   SARS Coronavirus 2 by RT PCR NEGATIVE NEGATIVE Final    Comment: (NOTE) SARS-CoV-2 target nucleic acids are NOT DETECTED.  The SARS-CoV-2 RNA is generally detectable in upper respiratory specimens during the acute phase of infection. The lowest concentration of SARS-CoV-2 viral copies this assay can detect is 138 copies/mL. A negative result does not preclude SARS-Cov-2 infection and should not be used as the sole basis for treatment or other patient management decisions. A negative result may occur with  improper specimen collection/handling, submission of specimen other than nasopharyngeal swab, presence of viral mutation(s) within the areas targeted by this assay, and inadequate number of viral copies(<138 copies/mL). A negative result must be combined with clinical  observations, patient history, and epidemiological information. The expected result is Negative.  Fact Sheet for Patients:  14/05/21  Fact Sheet for Healthcare Providers:  BloggerCourse.com  This test is no t yet approved or cleared by the [place] FDA and  has been authorized for detection and/or diagnosis of SARS-CoV-2 by FDA under an Emergency Use Authorization (EUA). This EUA will remain  in effect (meaning this test can be used) for the duration of the COVID-19 declaration under Section 564(b)(1) of the Act, 21 U.S.C.section 360bbb-3(b)(1), unless the authorization is terminated  or revoked sooner.       Influenza A by PCR NEGATIVE NEGATIVE Final   Influenza B by PCR NEGATIVE NEGATIVE Final    Comment: (NOTE) The Xpert Xpress SARS-CoV-2/FLU/RSV plus assay is intended as an aid  in the diagnosis of influenza from Nasopharyngeal swab specimens and should not be used as a sole basis for treatment. Nasal washings and aspirates are unacceptable for Xpert Xpress SARS-CoV-2/FLU/RSV testing.  Fact Sheet for Patients: BloggerCourse.com  Fact Sheet for Healthcare Providers: SeriousBroker.it  This test is not yet approved or cleared by the [place] FDA and has been authorized for detection and/or diagnosis of SARS-CoV-2 by FDA under an Emergency Use Authorization (EUA). This EUA will remain in effect (meaning this test can be used) for the duration of the COVID-19 declaration under Section 564(b)(1) of the Act, 21 U.S.C. section 360bbb-3(b)(1), unless the authorization is terminated or revoked.  Performed at [facility], [address], [place], [place] [address]   Culture, Urine     Status: None (Preliminary result)   Collection Time: 01/19/20  2:15 AM   Specimen: Urine, Clean Catch  Result Value Ref Range Status   Specimen Description URINE, CLEAN  CATCH  Final   Special Requests   Final    NONE Performed at [facility], [address], [place], [place] [address]    Culture PENDING  Incomplete   Report Status PENDING  Incomplete         Radiology Studies: DG Chest 1 View  Result Date: 01/19/2020 CLINICAL DATA:  Pneumonia EXAM: CHEST  1 VIEW COMPARISON:  None. FINDINGS: The heart size and mediastinal contours are within normal limits. Both lungs are clear. The visualized skeletal structures are unremarkable. IMPRESSION: No active disease. Electronically Signed   By: Jonna Clark M.D.   On: 01/19/2020 02:11        Scheduled Meds: . enoxaparin (LOVENOX) injection  40 mg Subcutaneous Daily  . pantoprazole (PROTONIX) IV  40 mg Intravenous Daily   Continuous Infusions: . lactated ringers 100 mL/hr at 01/19/20 0302     LOS: 3 days        Osvaldo Shipper, MD [facility]   To contact the attending provider between 7A-7P or the covering provider during after hours 7P-7A, please log into the web site [email or web address] and access using universal [place] password for that web site. If you do not have the password, please call the hospital operator.  01/19/2020, 10:21 AM

## 2020-01-19 NOTE — Progress Notes (Signed)
   01/19/20 0000 01/19/20 0100  Assess: MEWS Score  Temp  --  (!) 100.5 F (38.1 C)  Assess: MEWS Score  MEWS Temp  --  1  MEWS Systolic  --  0  MEWS Pulse  --  2  MEWS RR  --  0  MEWS LOC  --  0  MEWS Score  --  3  MEWS Score Color  --  Yellow  Assess: if the MEWS score is Yellow or Red  Were vital signs taken at a resting state? Yes  --   Focused Assessment No change from prior assessment  --   Early Detection of Sepsis Score *See Row Information* Low  --   MEWS guidelines implemented *See Row Information* No, vital signs rechecked  --   Treat  MEWS Interventions Administered prn meds/treatments  --   Pain Scale 0-10  --   Pain Score 0  --   Notify: Charge Nurse/RN  Name of Charge Nurse/RN Notified Mark, E rn  --

## 2020-01-19 NOTE — Progress Notes (Signed)
   01/19/20 0100  Notify: Provider  Provider Name/Title Reece Agar shalhoub  Date Provider Notified 01/19/20  Time Provider Notified 0119  Notification Type Page  Notification Reason Change in status

## 2020-01-19 NOTE — Progress Notes (Addendum)
Daily Rounding Note  01/19/2020, 11:35 AM  LOS: 3 days   SUBJECTIVE:   Chief complaint: acute pancreatitis, presumed biliary.    pain in upper abdomen is better.  No nausea, tolerating liquids.     OBJECTIVE:         Vital signs in last 24 hours:    Temp:  [98.2 F (36.8 C)-100.8 F (38.2 C)] 98.7 F (37.1 C) (12/08 0432) Pulse Rate:  [88-111] 88 (12/08 0432) Resp:  [16-18] 18 (12/08 0432) BP: (116-122)/(68-79) 118/77 (12/08 0432) SpO2:  [96 %-99 %] 97 % (12/08 0432) Weight:  [81.3 kg] 81.3 kg (12/08 0432) Last BM Date: 01/16/20 Filed Weights   01/17/20 0036 01/18/20 0357 01/19/20 0432  Weight: 81.6 kg 76.3 kg 81.3 kg   General: pleasant, comfortable.  Looks well   Heart: RRR Chest: clear bil Abdomen: soft, active BS, ND.  Tender bil in UQs.    Extremities: no CCE Neuro/Psych:  Oriented x 3.  No weakness or tremor.    Intake/Output from previous day: No intake/output data recorded.  Intake/Output this shift: No intake/output data recorded.  Lab Results: Recent Labs    01/17/20 0723 01/18/20 0308 01/19/20 0321  WBC 10.5 11.2* 11.3*  HGB 10.8* 10.9* 10.4*  HCT 35.2* 35.6* 31.6*  PLT 280 264 234   BMET Recent Labs    01/17/20 0723 01/18/20 0308 01/19/20 0321  NA 139 138 137  K 3.3* 3.8 3.5  CL 106 104 105  CO2 23 23 22   GLUCOSE 110* 94 111*  BUN 6 5* 6  CREATININE 0.48 0.56 0.54  CALCIUM 8.4* 8.6* 8.5*   LFT Recent Labs    01/17/20 0723 01/18/20 0308 01/19/20 0321  PROT 6.2* 6.6 6.4*  ALBUMIN 3.2* 3.3* 3.2*  AST 66* 28 18  ALT 68* 45* 30  ALKPHOS 121 118 107  BILITOT 1.1 1.4* 1.4*   PT/INR Recent Labs    01/17/20 0723  LABPROT 13.5  INR 1.1   Studies/Results: DG Chest 1 View  Result Date: 01/19/2020 CLINICAL DATA:  Pneumonia EXAM: CHEST  1 VIEW COMPARISON:  None. FINDINGS: The heart size and mediastinal contours are within normal limits. Both lungs are clear. The  visualized skeletal structures are unremarkable. IMPRESSION: No active disease. Electronically Signed   By: 14/09/2019 M.D.   On: 01/19/2020 02:11    ASSESMENT:   *    Acute pancreatitis. ?  Biliary due to passed stone?.   Patient does not drink alcoholic beverages. CTAP w contrast:   Acute pancreatitis without fluid collection or necrosis.  Uncomplicated cholelithiasis. Lipase 5629 >> 120.  Brief, mild elevation transaminases, resolved  *     Microcytic anemia.  Has heavy menstrual cycles. Total iron low normal, iron sat low at 8%.  Ferritin 29.  B12, folate okay.   PLAN   *    Referral to general surgery as outpatient for consideration of cholecystectomy with IOC.  *    Ordered daily multivitamins with iron.  Advanced to low fat diet.   If tolerates diet, could discharge later today.     14/09/2019  01/19/2020, 11:35 AM Phone [phone]    Attending Physician Note   I have taken an interval history, reviewed the chart and examined the patient. I agree with the Advanced Practitioner's note, impression and recommendations.   *Resolving mild acute pancreatitis, presumed biliary etiology. LFTs have normalized. Advance diet and consider discharge later today  or tomorrow. Outpatient GI follow up is not needed at this time. She needs to establish with a PCP. Outpatient appt with [facility] for consideration of cholecystectomy with IOC.  *IDA. Start Fe as outpatient.   GI signing off.   Claudette Head, MD [facility]

## 2020-01-20 ENCOUNTER — Inpatient Hospital Stay (HOSPITAL_COMMUNITY): Payer: Medicaid Other

## 2020-01-20 DIAGNOSIS — M7989 Other specified soft tissue disorders: Secondary | ICD-10-CM

## 2020-01-20 LAB — COMPREHENSIVE METABOLIC PANEL
ALT: 25 U/L (ref 0–44)
AST: 14 U/L — ABNORMAL LOW (ref 15–41)
Albumin: 2.9 g/dL — ABNORMAL LOW (ref 3.5–5.0)
Alkaline Phosphatase: 98 U/L (ref 38–126)
Anion gap: 11 (ref 5–15)
BUN: <5 mg/dL — ABNORMAL LOW (ref 6–20)
CO2: 24 mmol/L (ref 22–32)
Calcium: 8.6 mg/dL — ABNORMAL LOW (ref 8.9–10.3)
Chloride: 104 mmol/L (ref 98–111)
Creatinine, Ser: 0.56 mg/dL (ref 0.44–1.00)
GFR, Estimated: >60 mL/min (ref >60)
Glucose, Bld: 106 mg/dL — ABNORMAL HIGH (ref 70–99)
Potassium: 3.7 mmol/L (ref 3.5–5.1)
Sodium: 139 mmol/L (ref 135–145)
Total Bilirubin: 1.1 mg/dL (ref 0.3–1.2)
Total Protein: 6.4 g/dL — ABNORMAL LOW (ref 6.5–8.1)

## 2020-01-20 LAB — URINE CULTURE: Culture: 2000 — AB

## 2020-01-20 LAB — CBC
HCT: 31 % — ABNORMAL LOW (ref 36.0–46.0)
Hemoglobin: 9.9 g/dL — ABNORMAL LOW (ref 12.0–15.0)
MCH: 25 pg — ABNORMAL LOW (ref 26.0–34.0)
MCHC: 31.9 g/dL (ref 30.0–36.0)
MCV: 78.3 fL — ABNORMAL LOW (ref 80.0–100.0)
Platelets: 255 10*3/uL (ref 150–400)
RBC: 3.96 MIL/uL (ref 3.87–5.11)
RDW: 14.9 % (ref 11.5–15.5)
WBC: 10 10*3/uL (ref 4.0–10.5)
nRBC: 0 % (ref 0.0–0.2)

## 2020-01-20 LAB — MAGNESIUM: Magnesium: 1.8 mg/dL (ref 1.7–2.4)

## 2020-01-20 MED ORDER — IBUPROFEN 400 MG PO TABS: 400.0000 mg | ORAL_TABLET | Freq: Four times a day (QID) | ORAL | Status: DC | PRN

## 2020-01-20 NOTE — Lactation Note (Signed)
Lactation Consultation Note  Patient Name: Kristy Phillips [id number] Date: 01/20/2020 Reason for consult: Follow-up assessment   LC Follow Up Visit:  Spoke with [name], RN, and mother is no longer interested in breast feeding.  RN used the interpreter for clarification and mother will not be needing our services.  AC updated since she had called to request a consult.   Maternal Data    Feeding    LATCH Score                   Interventions    Lactation Tools Discussed/Used     Consult Status Consult Status: Complete    Charlotte Fidalgo R Curley [place] 01/20/2020, 6:38 PM

## 2020-01-20 NOTE — Progress Notes (Signed)
PROGRESS NOTE    Kristy Phillips  BOE:[id number] DOB: December 20, 1980 DOA: 01/16/2020 PCP: Pcp, No    Chief Complaint  Patient presents with  . Abdominal Pain    Brief Narrative:  39 year old lady , nursing 32 month old, presents to ED for abdominal pain associated with nausea and one episode of non bilious vomiting. CT abd and pelvis revealed peripancreatic fat stranding with peripancreatic free fluid suggestive of acute pancreatitis without evidence of necrosis. It also shows normal appearing liver as well as cholelithiasis without evidence of gb wall thickening or CBD dilation, no evidence of choledocholithiasis. GI consulted recommended conservative management with IV fluids and pain control followed by outpatient surgical consultation for possible cholecystectomy.    Assessment & Plan:   Acute Pancreatitis Thought to be secondary to biliary source.  She may have passed a gallstone.  Patient denies using alcohol.  Triglyceride levels normal.  Gastroenterology was consulted.  Pursuing conservative management.  Patient's diet was advanced yesterday.  Cut back on IV fluids.  Patient experiencing occasional loose stools which could just be due to the pancreatitis.  Imodium as needed. Lipase levels are improved.  Symptoms seem to be improving.  Patient not as tender today as compared to yesterday. Gastroenterology recommends outpatient referral to general surgery for elective cholecystectomy.  Fever Likely due to pancreatitis.  UA unremarkable.  Chest x-ray was unremarkable as well.  Blood cultures negative so far.  She does mention some lower extremity discomfort.  Will go ahead and do lower extremity Doppler studies.  No unusual vaginal discharge reported.  Loose stool likely due to pancreatitis.  Do not suspect any infectious etiology.  NSAIDs as needed.    Microcytic anemia/iron deficiency TIBC noted to be 405.  Iron 33% saturation 8.  Ferritin noted to be 29.  B12 level 289 and folate 22.3.   Will need iron supplementation at discharge.  No active bleeding present.    Hypokalemia and hypomagnesemia We will give additional dose of potassium today.  DVT prophylaxis: Lovenox.  Code Status: Full code Family Communication: none at bedside. Disposition: Hopefully return home when improved  Status is: Inpatient  Remains inpatient appropriate because:IV treatments appropriate due to intensity of illness or inability to take PO and Inpatient level of care appropriate due to severity of illness persistent abdominal pain and nausea.    Dispo: The patient is from: Home              Anticipated d/c is to: Home              Anticipated d/c date is: 2 days              Patient currently is not medically stable to d/c.       Consultants:   Gastroenterology.    Procedures: none.    Antimicrobials: None.    Subjective: Interpreter services were utilized.  Patient continues to report abdominal pain.  Occasionally in the back. 5/10 pain intensity.  No nausea vomiting.  Occasional loose stools.  No dysuria.  Does complain of abdominal pain in both her lower extremities.  Could be cramps per patient.     Objective: Vitals:   01/20/20 0128 01/20/20 0527 01/20/20 0745 01/20/20 0934  BP: 109/65 114/68 99/62 103/65  Pulse: (!) 106 (!) 101 86 83  Resp: 17 17 17 17   Temp: 99.5 F (37.5 C) (!) 101.1 F (38.4 C) 98.7 F (37.1 C) 98.6 F (37 C)  TempSrc: Oral Oral Oral Oral  SpO2: 97% 96% 97% 97%  Weight:  75.8 kg     No intake or output data in the 24 hours ending 01/20/20 1015 Filed Weights   01/18/20 0357 01/19/20 0432 01/20/20 0527  Weight: 76.3 kg 81.3 kg 75.8 kg    Examination:  General appearance: Awake alert.  In no distress Resp: Clear to auscultation bilaterally.  Normal effort Cardio: S1-S2 is normal regular.  No S3-S4.  No rubs murmurs or bruit GI: Abdomen is soft.  Less tender today compared to yesterday.  No rebound rigidity or guarding.  No masses  organomegaly.  Bowel sounds present. Extremities: No edema.  Full range of motion of lower extremities. Neurologic: Alert and oriented x3.  No focal neurological deficits.      Data Reviewed: I have personally reviewed following labs and imaging studies  CBC: Recent Labs  Lab 01/16/20 1800 01/17/20 0723 01/18/20 0308 01/19/20 0321 01/20/20 0250  WBC 12.1* 10.5 11.2* 11.3* 10.0  HGB 11.3* 10.8* 10.9* 10.4* 9.9*  HCT 37.5 35.2* 35.6* 31.6* 31.0*  MCV 80.6 79.8* 78.9* 77.6* 78.3*  PLT 282 280 264 234 255    Basic Metabolic Panel: Recent Labs  Lab 01/16/20 1800 01/17/20 0723 01/18/20 0308 01/19/20 0321 01/20/20 0250  NA 136 139 138 137 139  K 3.7 3.3* 3.8 3.5 3.7  CL 102 106 104 105 104  CO2 23 23 23 22 24   GLUCOSE 185* 110* 94 111* 106*  BUN 9 6 5* 6 <5*  CREATININE 0.68 0.48 0.56 0.54 0.56  CALCIUM 9.1 8.4* 8.6* 8.5* 8.6*  MG  --  1.6* 2.0  --  1.8    GFR: CrCl cannot be calculated (Unknown ideal weight.).  Liver Function Tests: Recent Labs  Lab 01/16/20 1800 01/17/20 0723 01/18/20 0308 01/19/20 0321 01/20/20 0250  AST 33 66* 28 18 14*  ALT 23 68* 45* 30 25  ALKPHOS 99 121 118 107 98  BILITOT 0.7 1.1 1.4* 1.4* 1.1  PROT 7.2 6.2* 6.6 6.4* 6.4*  ALBUMIN 3.9 3.2* 3.3* 3.2* 2.9*     Recent Results (from the past 240 hour(s))  Resp Panel by RT-PCR (Flu A&B, Covid) Nasopharyngeal Swab     Status: None   Collection Time: 01/16/20  9:31 PM   Specimen: Nasopharyngeal Swab; Nasopharyngeal(NP) swabs in vial transport medium  Result Value Ref Range Status   SARS Coronavirus 2 by RT PCR NEGATIVE NEGATIVE Final    Comment: (NOTE) SARS-CoV-2 target nucleic acids are NOT DETECTED.  The SARS-CoV-2 RNA is generally detectable in upper respiratory specimens during the acute phase of infection. The lowest concentration of SARS-CoV-2 viral copies this assay can detect is 138 copies/mL. A negative result does not preclude SARS-Cov-2 infection and should not be used  as the sole basis for treatment or other patient management decisions. A negative result may occur with  improper specimen collection/handling, submission of specimen other than nasopharyngeal swab, presence of viral mutation(s) within the areas targeted by this assay, and inadequate number of viral copies(<138 copies/mL). A negative result must be combined with clinical observations, patient history, and epidemiological information. The expected result is Negative.  Fact Sheet for Patients:  BloggerCourse.comhttps://www.fda.gov/media/152166/download  Fact Sheet for Healthcare Providers:  SeriousBroker.ithttps://www.fda.gov/media/152162/download  This test is no t yet approved or cleared by the [place] FDA and  has been authorized for detection and/or diagnosis of SARS-CoV-2 by FDA under an Emergency Use Authorization (EUA). This EUA will remain  in effect (meaning this test can be used) for the  duration of the COVID-19 declaration under Section 564(b)(1) of the Act, 21 U.S.C.section 360bbb-3(b)(1), unless the authorization is terminated  or revoked sooner.       Influenza A by PCR NEGATIVE NEGATIVE Final   Influenza B by PCR NEGATIVE NEGATIVE Final    Comment: (NOTE) The Xpert Xpress SARS-CoV-2/FLU/RSV plus assay is intended as an aid in the diagnosis of influenza from Nasopharyngeal swab specimens and should not be used as a sole basis for treatment. Nasal washings and aspirates are unacceptable for Xpert Xpress SARS-CoV-2/FLU/RSV testing.  Fact Sheet for Patients: BloggerCourse.com  Fact Sheet for Healthcare Providers: SeriousBroker.it  This test is not yet approved or cleared by the [place] FDA and has been authorized for detection and/or diagnosis of SARS-CoV-2 by FDA under an Emergency Use Authorization (EUA). This EUA will remain in effect (meaning this test can be used) for the duration of the COVID-19 declaration under Section 564(b)(1)  of the Act, 21 U.S.C. section 360bbb-3(b)(1), unless the authorization is terminated or revoked.  Performed at [facility], [address] [place], [place] [address]   Culture, Urine     Status: None (Preliminary result)   Collection Time: 01/19/20  2:15 AM   Specimen: Urine, Clean Catch  Result Value Ref Range Status   Specimen Description URINE, CLEAN CATCH  Final   Special Requests NONE  Final   Culture   Final    CULTURE REINCUBATED FOR BETTER GROWTH Performed at [facility], [address], [place], [place] [address]    Report Status PENDING  Incomplete  Culture, blood (routine x 2)     Status: None (Preliminary result)   Collection Time: 01/19/20  3:22 AM   Specimen: BLOOD  Result Value Ref Range Status   Specimen Description BLOOD LEFT ARM  Final   Special Requests   Final    BOTTLES DRAWN AEROBIC AND ANAEROBIC Blood Culture adequate volume   Culture   Final    NO GROWTH 1 DAY Performed at [facility], [address], [place], [place] [address]    Report Status PENDING  Incomplete  Culture, blood (routine x 2)     Status: None (Preliminary result)   Collection Time: 01/19/20  3:26 AM   Specimen: BLOOD  Result Value Ref Range Status   Specimen Description BLOOD LEFT HAND  Final   Special Requests   Final    BOTTLES DRAWN AEROBIC AND ANAEROBIC Blood Culture adequate volume   Culture   Final    NO GROWTH 1 DAY Performed at [facility], [address] [place], [place] [address]    Report Status PENDING  Incomplete         Radiology Studies: DG Chest 1 View  Result Date: 01/19/2020 CLINICAL DATA:  Pneumonia EXAM: CHEST  1 VIEW COMPARISON:  None. FINDINGS: The heart size and mediastinal contours are within normal limits. Both lungs are clear. The visualized skeletal structures are unremarkable. IMPRESSION: No active disease. Electronically Signed   By: Jonna Clark M.D.   On: 01/19/2020 02:11        Scheduled Meds: . enoxaparin  (LOVENOX) injection  40 mg Subcutaneous Daily  . multivitamins with iron  1 tablet Oral Daily   Continuous Infusions: . lactated ringers 100 mL/hr at 01/20/20 0406     LOS: 4 days        Osvaldo Shipper, MD [facility]   To contact the attending provider between 7A-7P or the covering  provider during after hours 7P-7A, please log into the web site [email or web address] and access using universal [place] password for that web site. If you do not have the password, please call the hospital operator.  01/20/2020, 10:15 AM

## 2020-01-20 NOTE — Progress Notes (Signed)
   01/20/20 0527 01/20/20 0600  Assess: MEWS Score  Temp (!) 101.1 F (38.4 C)  --   BP 114/68  --   Pulse Rate (!) 101  --   Resp 17  --   SpO2 96 %  --   O2 Device Room Air  --   Assess: MEWS Score  MEWS Temp 1  --   MEWS Systolic 0  --   MEWS Pulse 1  --   MEWS RR 0  --   MEWS LOC 0  --   MEWS Score 2  --   MEWS Score Color Yellow  --   Assess: if the MEWS score is Yellow or Red  Were vital signs taken at a resting state?  --  Yes  Focused Assessment  --  No change from prior assessment  Early Detection of Sepsis Score *See Row Information*  --  Low  MEWS guidelines implemented *See Row Information*  --  No, other (Comment)  Treat  MEWS Interventions  --  Administered prn meds/treatments  Pain Scale  --  0-10  Pain Score  --  5  Pain Type  --  Acute pain  Pain Location  --  Abdomen  Pain Orientation  --  Right;Left  Pain Intervention(s)  --  Medication (See eMAR)  Take Vital Signs  Increase Vital Sign Frequency   --  Yellow: Q 2hr X 2 then Q 4hr X 2, if remains yellow, continue Q 4hrs

## 2020-01-20 NOTE — Progress Notes (Signed)
Bilateral lower extremity venous study completed.      Please see CV Proc for preliminary results.   Clarinda Obi, RVT  

## 2020-01-20 NOTE — Progress Notes (Signed)
Spoke with Juliette Alcide, [facility]. She left message for lactation consultant to meet with patient ASAP-most likely tomorrow 12/10.

## 2020-01-21 LAB — COMPREHENSIVE METABOLIC PANEL
ALT: 20 U/L (ref 0–44)
AST: 11 U/L — ABNORMAL LOW (ref 15–41)
Albumin: 2.9 g/dL — ABNORMAL LOW (ref 3.5–5.0)
Alkaline Phosphatase: 90 U/L (ref 38–126)
Anion gap: 10 (ref 5–15)
BUN: 5 mg/dL — ABNORMAL LOW (ref 6–20)
CO2: 26 mmol/L (ref 22–32)
Calcium: 8.6 mg/dL — ABNORMAL LOW (ref 8.9–10.3)
Chloride: 102 mmol/L (ref 98–111)
Creatinine, Ser: 0.54 mg/dL (ref 0.44–1.00)
GFR, Estimated: >60 mL/min (ref >60)
Glucose, Bld: 109 mg/dL — ABNORMAL HIGH (ref 70–99)
Potassium: 3.6 mmol/L (ref 3.5–5.1)
Sodium: 138 mmol/L (ref 135–145)
Total Bilirubin: 0.7 mg/dL (ref 0.3–1.2)
Total Protein: 6.6 g/dL (ref 6.5–8.1)

## 2020-01-21 LAB — CBC
HCT: 31.8 % — ABNORMAL LOW (ref 36.0–46.0)
Hemoglobin: 9.8 g/dL — ABNORMAL LOW (ref 12.0–15.0)
MCH: 24.3 pg — ABNORMAL LOW (ref 26.0–34.0)
MCHC: 30.8 g/dL (ref 30.0–36.0)
MCV: 78.9 fL — ABNORMAL LOW (ref 80.0–100.0)
Platelets: 273 10*3/uL (ref 150–400)
RBC: 4.03 MIL/uL (ref 3.87–5.11)
RDW: 14.7 % (ref 11.5–15.5)
WBC: 9 10*3/uL (ref 4.0–10.5)
nRBC: 0 % (ref 0.0–0.2)

## 2020-01-21 LAB — LIPASE, BLOOD: Lipase: 35 U/L (ref 11–51)

## 2020-01-21 MED ORDER — ACETAMINOPHEN 325 MG PO TABS
650.0000 mg | ORAL_TABLET | Freq: Four times a day (QID) | ORAL | Status: DC | PRN
  Administered 2020-01-22: 650 mg via ORAL
  Filled 2020-01-21: qty 2

## 2020-01-21 MED ORDER — IBUPROFEN 400 MG PO TABS: 400.0000 mg | ORAL_TABLET | Freq: Three times a day (TID) | ORAL | 0 refills | Status: AC | PRN

## 2020-01-21 MED ORDER — CHLORHEXIDINE GLUCONATE CLOTH 2 % EX PADS
6.0000 | MEDICATED_PAD | Freq: Once | CUTANEOUS | Status: AC
  Administered 2020-01-21: 21:00:00 6 via TOPICAL

## 2020-01-21 MED ORDER — CHLORHEXIDINE GLUCONATE CLOTH 2 % EX PADS
6.0000 | MEDICATED_PAD | Freq: Once | CUTANEOUS | Status: AC
  Administered 2020-01-21: 22:00:00 6 via TOPICAL

## 2020-01-21 MED ORDER — FERROUS SULFATE 325 (65 FE) MG PO TABS: 325.0000 mg | ORAL_TABLET | Freq: Two times a day (BID) | ORAL | 3 refills | Status: AC

## 2020-01-21 MED ORDER — OXYCODONE HCL 5 MG PO TABS: 5.0000 mg | ORAL_TABLET | ORAL | 0 refills | Status: AC | PRN

## 2020-01-21 MED ORDER — ACETAMINOPHEN 500 MG PO TABS
1000.0000 mg | ORAL_TABLET | ORAL | Status: AC
  Administered 2020-01-22: 07:00:00 1000 mg via ORAL
  Filled 2020-01-21: qty 2

## 2020-01-21 MED ORDER — CEFAZOLIN SODIUM-DEXTROSE 2-4 GM/100ML-% IV SOLN
2.0000 g | INTRAVENOUS | Status: AC
  Administered 2020-01-22: 07:00:00 2 g via INTRAVENOUS
  Filled 2020-01-21: qty 100

## 2020-01-21 NOTE — Consult Note (Signed)
Kristy Phillips Oct 13, 1980  [id number].    Requesting MD: Dr. Rito Ehrlich Chief Complaint/Reason for Consult: Gallstone Pancreatitis    HPI: Kristy Phillips is a 39 y.o. female with no significant past medical history presented on 12/5 for abdominal pain.  Patient reports that she was cooking dinner around 5 PM on Sunday, 12/5 when she had sudden onset of nonradiating epigastric abdominal pain that was severe in nature.  She notes associated fever, chills, nausea and emesis.  She tried Tylenol for this without any relief.  She presented to the emergency department where she underwent work-up and was found to have acute pancreatitis on labs (lipase 5629) and imaging (acute pancreatitis without necrotizing pancreatitis).  Patient also noted to have cholelithiasis without evidence of choledocholithiasis or acute cholecystitis.  Patient was admitted to the hospitalist service.  GI was consulted.  Patient denies any recent alcohol use in the last 10 months since having her most recent child.  Triglycerides 65.  It was felt that pancreatitis was likely biliary in nature. Patients lipase improved, pain resolved and diet was advanced as tolerated.  Patient was set to be discharged today and we were consulted to arrange possible outpatient follow-up. Given risk of reoccurrence, our team asked to evaluate the patient prior to discharge to determine if patient would be a candidate for Lap Chole during admission.   Social Hx:  Patient is current unemployed but cares for her 5 children and grandchild at home. Lives at home with children and husband. Denies alcohol use, tobacco or illicit drug use.   ROS: Review of Systems  Constitutional: Positive for chills and fever.  Respiratory: Negative for cough and shortness of breath.   Cardiovascular: Negative for chest pain and leg swelling.  Gastrointestinal: Positive for abdominal pain, diarrhea, nausea and vomiting.  Genitourinary: Negative for dysuria.        Currently on her menstural cycle  Musculoskeletal: Negative for back pain.  Psychiatric/Behavioral: Negative for substance abuse.  All other systems reviewed and are negative.   Family History  Problem Relation Age of Onset  . Diabetes Mother   . Diabetes Maternal Aunt   . Diabetes Maternal Uncle   . Diabetes Maternal Grandmother     Past Medical History:  Diagnosis Date  . Medical history non-contributory     Past Surgical History:  Procedure Laterality Date  . NO PAST SURGERIES      Social History:  reports that she has never smoked. She has never used smokeless tobacco. She reports previous alcohol use. She reports that she does not use drugs.  Allergies: No Known Allergies  Medications Prior to Admission  Medication Sig Dispense Refill  . Omega-3 Fatty Acids (OMEGA-3 FISH OIL PO) Take 1 capsule by mouth daily.       Physical Exam: Blood pressure (!) 100/57, pulse 87, temperature 98.2 F (36.8 C), temperature source Oral, resp. rate 18, weight 74.9 kg, last menstrual period 01/06/2020, SpO2 97 %, currently breastfeeding. General: pleasant, WD/WN female who is laying in bed in NAD HEENT: head is normocephalic, atraumatic.  Sclera are noninjected.  PERRL.  Ears and nose without any masses or lesions.  Mouth is pink and moist. Dentition fair Heart: regular, rate, and rhythm.  Normal s1,s2. No obvious murmurs, gallops, or rubs noted.  Palpable pedal pulses bilaterally  Lungs: CTAB, no wheezes, rhonchi, or rales noted.  Respiratory effort nonlabored Abd: Soft, NT/ND, +BS, no masses, hernias, or organomegaly MS: no BUE/BLE edema, calves soft and nontender Skin: warm  and dry with no masses, lesions, or rashes Psych: A&Ox4 with an appropriate affect Neuro: cranial nerves grossly intact, equal strength in BUE/BLE bilaterally, normal speech, thought process intact  Results for orders placed or performed during the hospital encounter of 01/16/20 (from the past 48 hour(s))  CBC      Status: Abnormal   Collection Time: 01/20/20  2:50 AM  Result Value Ref Range   WBC 10.0 4.0 - 10.5 K/uL   RBC 3.96 3.87 - 5.11 MIL/uL   Hemoglobin 9.9 (L) 12.0 - 15.0 g/dL   HCT 16.131.0 (L) 09.636.0 - 04.546.0 %   MCV 78.3 (L) 80.0 - 100.0 fL   MCH 25.0 (L) 26.0 - 34.0 pg   MCHC 31.9 30.0 - 36.0 g/dL   RDW 40.914.9 81.111.5 - 91.415.5 %   Platelets 255 150 - 400 K/uL   nRBC 0.0 0.0 - 0.2 %    Comment: Performed at [facility], [address] [place], [place] [address]  Comprehensive metabolic panel     Status: Abnormal   Collection Time: 01/20/20  2:50 AM  Result Value Ref Range   Sodium 139 135 - 145 mmol/L   Potassium 3.7 3.5 - 5.1 mmol/L   Chloride 104 98 - 111 mmol/L   CO2 24 22 - 32 mmol/L   Glucose, Bld 106 (H) 70 - 99 mg/dL    Comment: Glucose reference range applies only to samples taken after fasting for at least 8 hours.   BUN <5 (L) 6 - 20 mg/dL   Creatinine, Ser 6.210.56 0.44 - 1.00 mg/dL   Calcium 8.6 (L) 8.9 - 10.3 mg/dL   Total Protein 6.4 (L) 6.5 - 8.1 g/dL   Albumin 2.9 (L) 3.5 - 5.0 g/dL   AST 14 (L) 15 - 41 U/L   ALT 25 0 - 44 U/L   Alkaline Phosphatase 98 38 - 126 U/L   Total Bilirubin 1.1 0.3 - 1.2 mg/dL   GFR, Estimated >30>60 >86>60 mL/min    Comment: (NOTE) Calculated using the CKD-EPI Creatinine Equation (2021)    Anion gap 11 5 - 15    Comment: Performed at [facility], [address] [place], [place] [address]  Magnesium     Status: None   Collection Time: 01/20/20  2:50 AM  Result Value Ref Range   Magnesium 1.8 1.7 - 2.4 mg/dL    Comment: Performed at [facility], [address] [place], [place] [address]  CBC     Status: Abnormal   Collection Time: 01/21/20  2:49 AM  Result Value Ref Range   WBC 9.0 4.0 - 10.5 K/uL   RBC 4.03 3.87 - 5.11 MIL/uL   Hemoglobin 9.8 (L) 12.0 - 15.0 g/dL   HCT 28.431.8 (L) 13.236.0 - 44.046.0 %   MCV 78.9 (L) 80.0 - 100.0 fL   MCH 24.3 (L) 26.0 - 34.0 pg   MCHC 30.8 30.0 - 36.0 g/dL   RDW 10.214.7 72.511.5 - 36.615.5 %   Platelets  273 150 - 400 K/uL   nRBC 0.0 0.0 - 0.2 %    Comment: Performed at [facility], [address] [place], [place] [address]  Comprehensive metabolic panel     Status: Abnormal   Collection Time: 01/21/20  2:49 AM  Result Value Ref Range   Sodium 138 135 - 145 mmol/L   Potassium 3.6 3.5 - 5.1 mmol/L   Chloride 102 98 - 111 mmol/L   CO2 26  22 - 32 mmol/L   Glucose, Bld 109 (H) 70 - 99 mg/dL    Comment: Glucose reference range applies only to samples taken after fasting for at least 8 hours.   BUN 5 (L) 6 - 20 mg/dL   Creatinine, Ser 9.93 0.44 - 1.00 mg/dL   Calcium 8.6 (L) 8.9 - 10.3 mg/dL   Total Protein 6.6 6.5 - 8.1 g/dL   Albumin 2.9 (L) 3.5 - 5.0 g/dL   AST 11 (L) 15 - 41 U/L   ALT 20 0 - 44 U/L   Alkaline Phosphatase 90 38 - 126 U/L   Total Bilirubin 0.7 0.3 - 1.2 mg/dL   GFR, Estimated >71 >69 mL/min    Comment: (NOTE) Calculated using the CKD-EPI Creatinine Equation (2021)    Anion gap 10 5 - 15    Comment: Performed at [facility], [address] [place], [place] [address]   VAS [place] LOWER EXTREMITY VENOUS (DVT)  Result Date: 01/20/2020  Lower Venous DVT Study Indications: Swelling.  Comparison Study: No previous Exam Performing Technologist: Clint Guy RVT  Examination Guidelines: A complete evaluation includes B-mode imaging, spectral Doppler, color Doppler, and power Doppler as needed of all accessible portions of each vessel. Bilateral testing is considered an integral part of a complete examination. Limited examinations for reoccurring indications may be performed as noted. The reflux portion of the exam is performed with the patient in reverse Trendelenburg.  +---------+---------------+---------+-----------+----------+--------------+ RIGHT    CompressibilityPhasicitySpontaneityPropertiesThrombus Aging +---------+---------------+---------+-----------+----------+--------------+ CFV      Full           Yes      Yes                                  +---------+---------------+---------+-----------+----------+--------------+ SFJ      Full                                                        +---------+---------------+---------+-----------+----------+--------------+ FV Prox  Full                                                        +---------+---------------+---------+-----------+----------+--------------+ FV Mid   Full                                                        +---------+---------------+---------+-----------+----------+--------------+ FV DistalFull                                                        +---------+---------------+---------+-----------+----------+--------------+ PFV      Full                                                        +---------+---------------+---------+-----------+----------+--------------+  POP      Full           Yes      Yes                                 +---------+---------------+---------+-----------+----------+--------------+ PTV      Full                                                        +---------+---------------+---------+-----------+----------+--------------+ PERO     Full                                                        +---------+---------------+---------+-----------+----------+--------------+   +---------+---------------+---------+-----------+----------+--------------+ LEFT     CompressibilityPhasicitySpontaneityPropertiesThrombus Aging +---------+---------------+---------+-----------+----------+--------------+ CFV      Full           Yes      Yes                                 +---------+---------------+---------+-----------+----------+--------------+ SFJ      Full                                                        +---------+---------------+---------+-----------+----------+--------------+ FV Prox  Full                                                         +---------+---------------+---------+-----------+----------+--------------+ FV Mid   Full                                                        +---------+---------------+---------+-----------+----------+--------------+ FV DistalFull                                                        +---------+---------------+---------+-----------+----------+--------------+ PFV      Full                                                        +---------+---------------+---------+-----------+----------+--------------+ POP      Full           Yes      Yes                                 +---------+---------------+---------+-----------+----------+--------------+  PTV      Full                                                        +---------+---------------+---------+-----------+----------+--------------+ PERO     Full                                                        +---------+---------------+---------+-----------+----------+--------------+     Summary: RIGHT: - There is no evidence of deep vein thrombosis in the lower extremity.  - No cystic structure found in the popliteal fossa.  LEFT: - There is no evidence of deep vein thrombosis in the lower extremity.  - No cystic structure found in the popliteal fossa.  *See table(s) above for measurements and observations. Electronically signed by Sherald Hess MD on 01/20/2020 at 5:00:33 PM.    Final    Anti-infectives (From admission, onward)   None      Assessment/Plan Gallstone Pancreatitis  - This is a 39 y.o. female that presented with epigastric abdominal pain, n/v on 12/5 and was found to have acute pancreatitis on imaging. She was noted to have cholelithiasis without choledocholithiasis or acute cholecystitis. Patient denies any recent alcohol use in the last 10 months since having her most recent child.  Triglycerides 65. Pancreatitis was felt to be biliary in nature. Patients pain has resolved. Given the risk of  recurrence, recommend Laparoscopic Cholecystectomy. I have explained the procedure, risks, and aftercare of cholecystectomy.  Risks include but are not limited to anesthesia (MI, CVA, death), bleeding, infection, wound problems, diarrhea, bile leak, injury to common bile duct/liver/intestine. She seems to understand and agrees to proceed. Will plan for OR tomorrow pending OR availability.   FEN - NPO at midnight, IVF VTE - SCDs, Lovenox ID - Ancef periop  Jacinto Halim, [facility] 01/21/2020, 2:23 PM Please see Amion for pager number during day hours 7:00am-4:30pm

## 2020-01-21 NOTE — Progress Notes (Signed)
RN gave patient discharge instructions with the help of Meriel Pica interpretor ID #[id number]. New medications escribed to pts home pharmacy. IV has been removed and pt called her ride, but General surg wants to see pt before DC so she is waiting for them before leaving.

## 2020-01-21 NOTE — Progress Notes (Signed)
PROGRESS NOTE    Kristy Phillips  RDE:[id number] DOB: 1980-12-02 DOA: 01/16/2020 PCP: Pcp, No    Chief Complaint  Patient presents with  . Abdominal Pain    Brief Narrative:  39 year old lady , nursing 55 month old, presents to ED for abdominal pain associated with nausea and one episode of non bilious vomiting. CT abd and pelvis revealed peripancreatic fat stranding with peripancreatic free fluid suggestive of acute pancreatitis without evidence of necrosis. It also shows normal appearing liver as well as cholelithiasis without evidence of gb wall thickening or CBD dilation, no evidence of choledocholithiasis. GI consulted recommended conservative management with IV fluids and pain control followed by outpatient surgical consultation for possible cholecystectomy.    Assessment & Plan:   Acute Pancreatitis Thought to be secondary to biliary source.  She may have passed a gallstone.  Patient denies using alcohol.  Triglyceride levels normal.  Gastroenterology was consulted.  Patient was given IV fluids.  Pain medications were prescribed.  Occasional loose stools.  Given Imodium for same.  Lipase level improved.  Abdomen remains benign.  Not as tender as the last few days.  Gastroenterology had recommended outpatient referral to general surgery.  Discussed with general surgery this morning and the plan was originally to set up an appointment with them in their office.  However the general surgeon subsequently spoke to the patient and offered inpatient surgery.  She is willing for the same.  We will hold discharge for now.    Fever Likely due to pancreatitis.  UA unremarkable.  Chest x-ray was unremarkable as well.  Blood cultures negative so far.  Patient mentions lower extremity discomfort.  Doppler studies were ordered which were negative for DVT.  No unusual vaginal discharge.  Loose stools secondary to pancreatitis.  Fever appears to be subsiding.  Do not suspect any infectious etiology.   NSAIDs as needed.    Microcytic anemia/iron deficiency TIBC noted to be 405.  Iron 33% saturation 8.  Ferritin noted to be 29.  B12 level 289 and folate 22.3.  Will need iron supplementation at discharge.  No active bleeding present.    Hypokalemia and hypomagnesemia Potassium normal.  Magnesium 1.8 yesterday.  DVT prophylaxis: Lovenox.  Code Status: Full code Family Communication: none at bedside. Disposition: Hopefully return home when improved  Status is: Inpatient  Remains inpatient appropriate because:IV treatments appropriate due to intensity of illness or inability to take PO and Inpatient level of care appropriate due to severity of illness persistent abdominal pain and nausea.    Dispo: The patient is from: Home              Anticipated d/c is to: Home              Anticipated d/c date is: 2 days              Patient currently is not medically stable to d/c.       Consultants:   Gastroenterology.    Procedures: none.    Antimicrobials: None.    Subjective: Interpreter services were utilized.  Patient mentions that abdominal pain has improved.  4 out of 10 in intensity at times but most of the time she is without any discomfort.  Has required some oral pain medications in the last 24 hours.  No nausea vomiting.  No further leg pain.  No dysuria.     Objective: Vitals:   01/20/20 1738 01/21/20 0018 01/21/20 0528 01/21/20 0854  BP: 106/66 112/70 Marland Kitchen)  100/57   Pulse: 91 97 87   Resp: 17 18 18    Temp: 99.2 F (37.3 C) 99.6 F (37.6 C) 99.2 F (37.3 C) 98.2 F (36.8 C)  TempSrc: Oral Oral Oral Oral  SpO2: 98% 98% 97%   Weight:   74.9 kg     Intake/Output Summary (Last 24 hours) at 01/21/2020 1246 Last data filed at 01/21/2020 0900 Gross per 24 hour  Intake 240 ml  Output -  Net 240 ml   Filed Weights   01/19/20 0432 01/20/20 0527 01/21/20 0528  Weight: 81.3 kg 75.8 kg 74.9 kg    Examination:  General appearance: Awake alert.  In no  distress Resp: Clear to auscultation bilaterally.  Normal effort Cardio: S1-S2 is normal regular.  No S3-S4.  No rubs murmurs or bruit GI: Abdomen is soft.  No tenderness appreciated today.  Bowel sounds are present.  No masses organomegaly.   Extremities: No edema.  Full range of motion of lower extremities. Neurologic: Alert and oriented x3.  No focal neurological deficits.       Data Reviewed: I have personally reviewed following labs and imaging studies  CBC: Recent Labs  Lab 01/17/20 0723 01/18/20 0308 01/19/20 0321 01/20/20 0250 01/21/20 0249  WBC 10.5 11.2* 11.3* 10.0 9.0  HGB 10.8* 10.9* 10.4* 9.9* 9.8*  HCT 35.2* 35.6* 31.6* 31.0* 31.8*  MCV 79.8* 78.9* 77.6* 78.3* 78.9*  PLT 280 264 234 255 273    Basic Metabolic Panel: Recent Labs  Lab 01/17/20 0723 01/18/20 0308 01/19/20 0321 01/20/20 0250 01/21/20 0249  NA 139 138 137 139 138  K 3.3* 3.8 3.5 3.7 3.6  CL 106 104 105 104 102  CO2 23 23 22 24 26   GLUCOSE 110* 94 111* 106* 109*  BUN 6 5* 6 <5* 5*  CREATININE 0.48 0.56 0.54 0.56 0.54  CALCIUM 8.4* 8.6* 8.5* 8.6* 8.6*  MG 1.6* 2.0  --  1.8  --     GFR: CrCl cannot be calculated (Unknown ideal weight.).  Liver Function Tests: Recent Labs  Lab 01/17/20 0723 01/18/20 0308 01/19/20 0321 01/20/20 0250 01/21/20 0249  AST 66* 28 18 14* 11*  ALT 68* 45* 30 25 20   ALKPHOS 121 118 107 98 90  BILITOT 1.1 1.4* 1.4* 1.1 0.7  PROT 6.2* 6.6 6.4* 6.4* 6.6  ALBUMIN 3.2* 3.3* 3.2* 2.9* 2.9*     Recent Results (from the past 240 hour(s))  Resp Panel by RT-PCR (Flu A&B, Covid) Nasopharyngeal Swab     Status: None   Collection Time: 01/16/20  9:31 PM   Specimen: Nasopharyngeal Swab; Nasopharyngeal(NP) swabs in vial transport medium  Result Value Ref Range Status   SARS Coronavirus 2 by RT PCR NEGATIVE NEGATIVE Final    Comment: (NOTE) SARS-CoV-2 target nucleic acids are NOT DETECTED.  The SARS-CoV-2 RNA is generally detectable in upper  respiratory specimens during the acute phase of infection. The lowest concentration of SARS-CoV-2 viral copies this assay can detect is 138 copies/mL. A negative result does not preclude SARS-Cov-2 infection and should not be used as the sole basis for treatment or other patient management decisions. A negative result may occur with  improper specimen collection/handling, submission of specimen other than nasopharyngeal swab, presence of viral mutation(s) within the areas targeted by this assay, and inadequate number of viral copies(<138 copies/mL). A negative result must be combined with clinical observations, patient history, and epidemiological information. The expected result is Negative.  Fact Sheet for Patients:  14/10/21  Fact  Sheet for Healthcare Providers:  SeriousBroker.it  This test is no t yet approved or cleared by the [place] FDA and  has been authorized for detection and/or diagnosis of SARS-CoV-2 by FDA under an Emergency Use Authorization (EUA). This EUA will remain  in effect (meaning this test can be used) for the duration of the COVID-19 declaration under Section 564(b)(1) of the Act, 21 U.S.C.section 360bbb-3(b)(1), unless the authorization is terminated  or revoked sooner.       Influenza A by PCR NEGATIVE NEGATIVE Final   Influenza B by PCR NEGATIVE NEGATIVE Final    Comment: (NOTE) The Xpert Xpress SARS-CoV-2/FLU/RSV plus assay is intended as an aid in the diagnosis of influenza from Nasopharyngeal swab specimens and should not be used as a sole basis for treatment. Nasal washings and aspirates are unacceptable for Xpert Xpress SARS-CoV-2/FLU/RSV testing.  Fact Sheet for Patients: BloggerCourse.com  Fact Sheet for Healthcare Providers: SeriousBroker.it  This test is not yet approved or cleared by the [place] FDA and has been  authorized for detection and/or diagnosis of SARS-CoV-2 by FDA under an Emergency Use Authorization (EUA). This EUA will remain in effect (meaning this test can be used) for the duration of the COVID-19 declaration under Section 564(b)(1) of the Act, 21 U.S.C. section 360bbb-3(b)(1), unless the authorization is terminated or revoked.  Performed at [facility], [address] [place], [place] [address]   Culture, Urine     Status: Abnormal   Collection Time: 01/19/20  2:15 AM   Specimen: Urine, Clean Catch  Result Value Ref Range Status   Specimen Description URINE, CLEAN CATCH  Final   Special Requests NONE  Final   Culture (A)  Final    2,000 COLONIES/mL GROUP B STREP(S.AGALACTIAE)ISOLATED TESTING AGAINST S. AGALACTIAE NOT ROUTINELY PERFORMED DUE TO PREDICTABILITY OF AMP/PEN/VAN SUSCEPTIBILITY. Performed at [facility], [address] [place], [place] [address]    Report Status 01/20/2020 FINAL  Final  Culture, blood (routine x 2)     Status: None (Preliminary result)   Collection Time: 01/19/20  3:22 AM   Specimen: BLOOD  Result Value Ref Range Status   Specimen Description BLOOD LEFT ARM  Final   Special Requests   Final    BOTTLES DRAWN AEROBIC AND ANAEROBIC Blood Culture adequate volume   Culture   Final    NO GROWTH 2 DAYS Performed at [facility], [address] [place], [place] [address]    Report Status PENDING  Incomplete  Culture, blood (routine x 2)     Status: None (Preliminary result)   Collection Time: 01/19/20  3:26 AM   Specimen: BLOOD  Result Value Ref Range Status   Specimen Description BLOOD LEFT HAND  Final   Special Requests   Final    BOTTLES DRAWN AEROBIC AND ANAEROBIC Blood Culture adequate volume   Culture   Final    NO GROWTH 2 DAYS Performed at [facility], [address], [place], [place] [address]    Report Status PENDING  Incomplete         Radiology Studies: VAS [place] LOWER EXTREMITY VENOUS  (DVT)  Result Date: 01/20/2020  Lower Venous DVT Study Indications: Swelling.  Comparison Study: No previous Exam Performing Technologist: Clint Guy RVT  Examination Guidelines: A complete evaluation includes B-mode imaging, spectral Doppler, color Doppler, and power Doppler as needed of all accessible portions of each vessel. Bilateral testing is considered an integral part of a complete  examination. Limited examinations for reoccurring indications may be performed as noted. The reflux portion of the exam is performed with the patient in reverse Trendelenburg.  +---------+---------------+---------+-----------+----------+--------------+ RIGHT    CompressibilityPhasicitySpontaneityPropertiesThrombus Aging +---------+---------------+---------+-----------+----------+--------------+ CFV      Full           Yes      Yes                                 +---------+---------------+---------+-----------+----------+--------------+ SFJ      Full                                                        +---------+---------------+---------+-----------+----------+--------------+ FV Prox  Full                                                        +---------+---------------+---------+-----------+----------+--------------+ FV Mid   Full                                                        +---------+---------------+---------+-----------+----------+--------------+ FV DistalFull                                                        +---------+---------------+---------+-----------+----------+--------------+ PFV      Full                                                        +---------+---------------+---------+-----------+----------+--------------+ POP      Full           Yes      Yes                                 +---------+---------------+---------+-----------+----------+--------------+ PTV      Full                                                         +---------+---------------+---------+-----------+----------+--------------+ PERO     Full                                                        +---------+---------------+---------+-----------+----------+--------------+   +---------+---------------+---------+-----------+----------+--------------+ LEFT     CompressibilityPhasicitySpontaneityPropertiesThrombus Aging +---------+---------------+---------+-----------+----------+--------------+ CFV      Full  Yes      Yes                                 +---------+---------------+---------+-----------+----------+--------------+ SFJ      Full                                                        +---------+---------------+---------+-----------+----------+--------------+ FV Prox  Full                                                        +---------+---------------+---------+-----------+----------+--------------+ FV Mid   Full                                                        +---------+---------------+---------+-----------+----------+--------------+ FV DistalFull                                                        +---------+---------------+---------+-----------+----------+--------------+ PFV      Full                                                        +---------+---------------+---------+-----------+----------+--------------+ POP      Full           Yes      Yes                                 +---------+---------------+---------+-----------+----------+--------------+ PTV      Full                                                        +---------+---------------+---------+-----------+----------+--------------+ PERO     Full                                                        +---------+---------------+---------+-----------+----------+--------------+     Summary: RIGHT: - There is no evidence of deep vein thrombosis in the lower extremity.  - No cystic structure found in  the popliteal fossa.  LEFT: - There is no evidence of deep vein thrombosis in the lower extremity.  - No cystic structure found in the popliteal fossa.  *See table(s) above for measurements and observations. Electronically signed by Sherald Hesshristopher Clark MD on 01/20/2020 at 5:00:33  PM.    Final         Scheduled Meds: . enoxaparin (LOVENOX) injection  40 mg Subcutaneous Daily  . multivitamins with iron  1 tablet Oral Daily   Continuous Infusions: . lactated ringers 20 mL/hr at 01/20/20 1104     LOS: 5 days        Osvaldo Shipper, MD [facility]   To contact the attending provider between 7A-7P or the covering provider during after hours 7P-7A, please log into the web site [email or web address] and access using universal [place] password for that web site. If you do not have the password, please call the hospital operator.  01/21/2020, 12:46 PM

## 2020-01-22 ENCOUNTER — Inpatient Hospital Stay (HOSPITAL_COMMUNITY): Payer: Medicaid Other

## 2020-01-22 ENCOUNTER — Inpatient Hospital Stay (HOSPITAL_COMMUNITY): Payer: Medicaid Other | Admitting: Certified Registered Nurse Anesthetist

## 2020-01-22 ENCOUNTER — Encounter (HOSPITAL_COMMUNITY)
Admission: EM | Disposition: A | Discharge status: Home / Self Care | Payer: Self-pay | Source: Home / Self Care | Attending: Internal Medicine

## 2020-01-22 ENCOUNTER — Encounter (HOSPITAL_COMMUNITY): Payer: Self-pay | Admitting: Internal Medicine

## 2020-01-22 DIAGNOSIS — D500 Iron deficiency anemia secondary to blood loss (chronic): Secondary | ICD-10-CM

## 2020-01-22 HISTORY — PX: CHOLECYSTECTOMY: SHX55

## 2020-01-22 LAB — COMPREHENSIVE METABOLIC PANEL
ALT: 19 U/L (ref 0–44)
AST: 11 U/L — ABNORMAL LOW (ref 15–41)
Albumin: 3.1 g/dL — ABNORMAL LOW (ref 3.5–5.0)
Alkaline Phosphatase: 89 U/L (ref 38–126)
Anion gap: 11 (ref 5–15)
BUN: 7 mg/dL (ref 6–20)
CO2: 25 mmol/L (ref 22–32)
Calcium: 8.8 mg/dL — ABNORMAL LOW (ref 8.9–10.3)
Chloride: 102 mmol/L (ref 98–111)
Creatinine, Ser: 0.48 mg/dL (ref 0.44–1.00)
GFR, Estimated: >60 mL/min (ref >60)
Glucose, Bld: 114 mg/dL — ABNORMAL HIGH (ref 70–99)
Potassium: 3.7 mmol/L (ref 3.5–5.1)
Sodium: 138 mmol/L (ref 135–145)
Total Bilirubin: 0.6 mg/dL (ref 0.3–1.2)
Total Protein: 6.9 g/dL (ref 6.5–8.1)

## 2020-01-22 LAB — CBC
HCT: 32.1 % — ABNORMAL LOW (ref 36.0–46.0)
Hemoglobin: 9.9 g/dL — ABNORMAL LOW (ref 12.0–15.0)
MCH: 24.3 pg — ABNORMAL LOW (ref 26.0–34.0)
MCHC: 30.8 g/dL (ref 30.0–36.0)
MCV: 78.7 fL — ABNORMAL LOW (ref 80.0–100.0)
Platelets: 328 10*3/uL (ref 150–400)
RBC: 4.08 MIL/uL (ref 3.87–5.11)
RDW: 14.6 % (ref 11.5–15.5)
WBC: 7.9 10*3/uL (ref 4.0–10.5)
nRBC: 0 % (ref 0.0–0.2)

## 2020-01-22 SURGERY — LAPAROSCOPIC CHOLECYSTECTOMY WITH INTRAOPERATIVE CHOLANGIOGRAM
Anesthesia: General | Site: Abdomen

## 2020-01-22 MED ORDER — FENTANYL CITRATE (PF) 250 MCG/5ML IJ SOLN
INTRAMUSCULAR | Status: DC | PRN
  Administered 2020-01-22: 100 ug via INTRAVENOUS

## 2020-01-22 MED ORDER — OXYCODONE HCL 5 MG PO TABS
5.0000 mg | ORAL_TABLET | ORAL | Status: DC | PRN
  Administered 2020-01-22: 5 mg via ORAL
  Filled 2020-01-22 (×2): qty 1

## 2020-01-22 MED ORDER — SODIUM CHLORIDE 0.9 % IV SOLN
INTRAVENOUS | Status: DC | PRN
  Administered 2020-01-22: 13:00:00 10 mL

## 2020-01-22 MED ORDER — CHLORHEXIDINE GLUCONATE 0.12 % MT SOLN: 15.0000 mL | Freq: Once | OROMUCOSAL | Status: AC

## 2020-01-22 MED ORDER — ROCURONIUM BROMIDE 10 MG/ML (PF) SYRINGE
PREFILLED_SYRINGE | INTRAVENOUS | Status: DC | PRN
  Administered 2020-01-22: 50 mg via INTRAVENOUS

## 2020-01-22 MED ORDER — ROCURONIUM BROMIDE 10 MG/ML (PF) SYRINGE
PREFILLED_SYRINGE | INTRAVENOUS | Status: AC
  Filled 2020-01-22: qty 10

## 2020-01-22 MED ORDER — HYDROMORPHONE HCL 1 MG/ML IJ SOLN: 0.5000 mg | INTRAMUSCULAR | Status: DC | PRN

## 2020-01-22 MED ORDER — CHLORHEXIDINE GLUCONATE 0.12 % MT SOLN
OROMUCOSAL | Status: AC
  Administered 2020-01-22: 12:00:00 15 mL via OROMUCOSAL
  Filled 2020-01-22: qty 15

## 2020-01-22 MED ORDER — CEFAZOLIN SODIUM-DEXTROSE 2-3 GM-%(50ML) IV SOLR
INTRAVENOUS | Status: DC | PRN
  Administered 2020-01-22: 2 g via INTRAVENOUS

## 2020-01-22 MED ORDER — LIDOCAINE 2% (20 MG/ML) 5 ML SYRINGE
INTRAMUSCULAR | Status: DC | PRN
  Administered 2020-01-22: 60 mg via INTRAVENOUS

## 2020-01-22 MED ORDER — ACETAMINOPHEN 325 MG PO TABS
650.0000 mg | ORAL_TABLET | Freq: Four times a day (QID) | ORAL | Status: DC
  Administered 2020-01-22 – 2020-01-23 (×4): 650 mg via ORAL
  Filled 2020-01-22 (×4): qty 2

## 2020-01-22 MED ORDER — FENTANYL CITRATE (PF) 250 MCG/5ML IJ SOLN
INTRAMUSCULAR | Status: AC
  Filled 2020-01-22: qty 5

## 2020-01-22 MED ORDER — OXYCODONE HCL 5 MG PO TABS
10.0000 mg | ORAL_TABLET | ORAL | Status: DC | PRN
  Administered 2020-01-22 – 2020-01-23 (×5): 10 mg via ORAL
  Filled 2020-01-22 (×5): qty 2

## 2020-01-22 MED ORDER — DEXAMETHASONE SODIUM PHOSPHATE 10 MG/ML IJ SOLN
INTRAMUSCULAR | Status: DC | PRN
  Administered 2020-01-22: 10 mg via INTRAVENOUS

## 2020-01-22 MED ORDER — 0.9 % SODIUM CHLORIDE (POUR BTL) OPTIME
TOPICAL | Status: DC | PRN
  Administered 2020-01-22: 13:00:00 1000 mL

## 2020-01-22 MED ORDER — BUPIVACAINE-EPINEPHRINE (PF) 0.25% -1:200000 IJ SOLN
INTRAMUSCULAR | Status: AC
  Filled 2020-01-22: qty 30

## 2020-01-22 MED ORDER — MIDAZOLAM HCL 2 MG/2ML IJ SOLN
INTRAMUSCULAR | Status: AC
  Filled 2020-01-22: qty 2

## 2020-01-22 MED ORDER — BUPIVACAINE-EPINEPHRINE 0.25% -1:200000 IJ SOLN
INTRAMUSCULAR | Status: DC | PRN
  Administered 2020-01-22: 8 mL

## 2020-01-22 MED ORDER — SODIUM CHLORIDE 0.9 % IR SOLN
Status: DC | PRN
  Administered 2020-01-22: 1000 mL

## 2020-01-22 MED ORDER — PROPOFOL 10 MG/ML IV BOLUS
INTRAVENOUS | Status: AC
  Filled 2020-01-22: qty 20

## 2020-01-22 MED ORDER — MIDAZOLAM HCL 2 MG/2ML IJ SOLN
INTRAMUSCULAR | Status: DC | PRN
  Administered 2020-01-22: 2 mg via INTRAVENOUS

## 2020-01-22 MED ORDER — PROPOFOL 10 MG/ML IV BOLUS
INTRAVENOUS | Status: DC | PRN
  Administered 2020-01-22: 150 mg via INTRAVENOUS

## 2020-01-22 MED ORDER — LACTATED RINGERS IV SOLN: INTRAVENOUS | Status: DC

## 2020-01-22 MED ORDER — KETOROLAC TROMETHAMINE 30 MG/ML IJ SOLN
INTRAMUSCULAR | Status: DC | PRN
  Administered 2020-01-22: 30 mg via INTRAVENOUS

## 2020-01-22 MED ORDER — SUGAMMADEX SODIUM 200 MG/2ML IV SOLN
INTRAVENOUS | Status: DC | PRN
  Administered 2020-01-22: 200 mg via INTRAVENOUS

## 2020-01-22 SURGICAL SUPPLY — 47 items
APPLIER CLIP ROT 10 11.4 M/L (STAPLE) ×2
BENZOIN TINCTURE PRP APPL 2/3 (GAUZE/BANDAGES/DRESSINGS) ×2 IMPLANT
CANISTER SUCT 3000ML PPV (MISCELLANEOUS) ×2 IMPLANT
CHLORAPREP W/TINT 26 (MISCELLANEOUS) ×2 IMPLANT
CLIP APPLIE ROT 10 11.4 M/L (STAPLE) ×1 IMPLANT
COVER MAYO STAND STRL (DRAPES) ×2 IMPLANT
COVER SURGICAL LIGHT HANDLE (MISCELLANEOUS) ×2 IMPLANT
DERMABOND ADVANCED (GAUZE/BANDAGES/DRESSINGS) ×1
DERMABOND ADVANCED .7 DNX12 (GAUZE/BANDAGES/DRESSINGS) ×1 IMPLANT
DRAPE C-ARM 42X120 X-RAY (DRAPES) ×2 IMPLANT
DRAPE C-ARM 42X72 X-RAY (DRAPES) ×2 IMPLANT
DRSG TEGADERM 2-3/8X2-3/4 SM (GAUZE/BANDAGES/DRESSINGS) IMPLANT
DRSG TEGADERM 4X4.75 (GAUZE/BANDAGES/DRESSINGS) IMPLANT
ELECT REM PT RETURN 9FT ADLT (ELECTROSURGICAL) ×2
ELECTRODE REM PT RTRN 9FT ADLT (ELECTROSURGICAL) ×1 IMPLANT
ENDOLOOP SUT PDS II  0 18 (SUTURE) ×1
ENDOLOOP SUT PDS II 0 18 (SUTURE) ×1 IMPLANT
GAUZE SPONGE 2X2 8PLY STRL LF (GAUZE/BANDAGES/DRESSINGS) IMPLANT
GLOVE BIO SURGEON STRL SZ7 (GLOVE) ×8 IMPLANT
GLOVE BIOGEL PI IND STRL 7.5 (GLOVE) ×3 IMPLANT
GLOVE BIOGEL PI INDICATOR 7.5 (GLOVE) ×3
GOWN STRL REUS W/ TWL LRG LVL3 (GOWN DISPOSABLE) ×3 IMPLANT
GOWN STRL REUS W/TWL LRG LVL3 (GOWN DISPOSABLE) ×3
KIT BASIN OR (CUSTOM PROCEDURE TRAY) ×2 IMPLANT
KIT TURNOVER KIT B (KITS) ×2 IMPLANT
NEEDLE BLUNT 18X1 FOR OR ONLY (NEEDLE) ×2 IMPLANT
NEEDLE INSUFFLATION 14GA 120MM (NEEDLE) ×2 IMPLANT
NS IRRIG 1000ML POUR BTL (IV SOLUTION) ×2 IMPLANT
PAD ARMBOARD 7.5X6 YLW CONV (MISCELLANEOUS) ×2 IMPLANT
POUCH RETRIEVAL ECOSAC 10 (ENDOMECHANICALS) ×1 IMPLANT
POUCH RETRIEVAL ECOSAC 10MM (ENDOMECHANICALS) ×1
SCISSORS LAP 5X35 DISP (ENDOMECHANICALS) ×2 IMPLANT
SET CHOLANGIOGRAPH 5 50 .035 (SET/KITS/TRAYS/PACK) ×2 IMPLANT
SET IRRIG TUBING LAPAROSCOPIC (IRRIGATION / IRRIGATOR) ×2 IMPLANT
SET TUBE SMOKE EVAC HIGH FLOW (TUBING) ×2 IMPLANT
SLEEVE ENDOPATH XCEL 5M (ENDOMECHANICALS) ×4 IMPLANT
SPECIMEN JAR SMALL (MISCELLANEOUS) ×2 IMPLANT
SPONGE GAUZE 2X2 STER 10/PKG (GAUZE/BANDAGES/DRESSINGS)
STRIP CLOSURE SKIN 1/2X4 (GAUZE/BANDAGES/DRESSINGS) IMPLANT
SUT MNCRL AB 4-0 PS2 18 (SUTURE) ×2 IMPLANT
SYR 20ML LL LF (SYRINGE) ×2 IMPLANT
TOWEL GREEN STERILE (TOWEL DISPOSABLE) ×2 IMPLANT
TOWEL GREEN STERILE FF (TOWEL DISPOSABLE) ×2 IMPLANT
TRAY LAPAROSCOPIC MC (CUSTOM PROCEDURE TRAY) ×2 IMPLANT
TROCAR XCEL BLUNT TIP 100MML (ENDOMECHANICALS) ×2 IMPLANT
TROCAR XCEL NON-BLD 11X100MML (ENDOMECHANICALS) ×2 IMPLANT
TROCAR XCEL NON-BLD 5MMX100MML (ENDOMECHANICALS) ×2 IMPLANT

## 2020-01-22 NOTE — Anesthesia Postprocedure Evaluation (Signed)
Anesthesia Post Note  Patient: Kristy Phillips  Procedure(s) Performed: LAPAROSCOPIC CHOLECYSTECTOMY WITH INTRAOPERATIVE CHOLANGIOGRAM (N/A Abdomen)     Patient location during evaluation: PACU Anesthesia Type: General Level of consciousness: awake and alert Pain management: pain level controlled Vital Signs Assessment: post-procedure vital signs reviewed and stable Respiratory status: spontaneous breathing, nonlabored ventilation, respiratory function stable and patient connected to nasal cannula oxygen Cardiovascular status: blood pressure returned to baseline and stable Postop Assessment: no apparent nausea or vomiting Anesthetic complications: no   No complications documented.  Last Vitals:  Vitals:   01/22/20 1405 01/22/20 1441  BP: 126/69 (!) 112/91  Pulse: 80 75  Resp: 18 18  Temp: 36.7 C 36.9 C  SpO2: 97% 98%    Last Pain:  Vitals:   01/22/20 1516  TempSrc:   PainSc: 5                  Armine Rizzolo P Suki Crockett

## 2020-01-22 NOTE — Anesthesia Preprocedure Evaluation (Signed)
Anesthesia Evaluation  Patient identified by MRN, date of birth, ID band Patient awake    Reviewed: Patient's Chart, lab work & pertinent test results  Airway Mallampati: II  TM Distance: >3 FB Neck ROM: Full    Dental  (+) Teeth Intact   Pulmonary neg pulmonary ROS,    Pulmonary exam normal        Cardiovascular negative cardio ROS   Rhythm:Regular Rate:Normal     Neuro/Psych negative neurological ROS  negative psych ROS   GI/Hepatic Neg liver ROS, Gallstone pancreatitis    Endo/Other  negative endocrine ROS  Renal/GU negative Renal ROS  negative genitourinary   Musculoskeletal negative musculoskeletal ROS (+)   Abdominal (+)  Abdomen: soft. Bowel sounds: normal.  Peds  Hematology  (+) anemia ,   Anesthesia Other Findings   Reproductive/Obstetrics                             Anesthesia Physical Anesthesia Plan  ASA: II  Anesthesia Plan: General   Post-op Pain Management:    Induction: Intravenous  PONV Risk Score and Plan: 3 and Ondansetron, Dexamethasone, Midazolam and Treatment may vary due to age or medical condition  Airway Management Planned: Mask and Oral ETT  Additional Equipment: None  Intra-op Plan:   Post-operative Plan: Extubation in OR  Informed Consent: I have reviewed the patients History and Physical, chart, labs and discussed the procedure including the risks, benefits and alternatives for the proposed anesthesia with the patient or authorized representative who has indicated his/her understanding and acceptance.     Dental advisory given  Plan Discussed with: CRNA  Anesthesia Plan Comments: (Lab Results      Component                Value               Date                      WBC                      7.9                 01/22/2020                HGB                      9.9 (L)             01/22/2020                HCT                       32.1 (L)            01/22/2020                MCV                      78.7 (L)            01/22/2020                PLT                      328                 01/22/2020  Lab Results      Component                Value               Date                      NA                       138                 01/22/2020                K                        3.7                 01/22/2020                CO2                      25                  01/22/2020                GLUCOSE                  114 (H)             01/22/2020                BUN                      7                   01/22/2020                CREATININE               0.48                01/22/2020                CALCIUM                  8.8 (L)             01/22/2020                GFRNONAA                 >60                 01/22/2020                GFRAA                    >60                 08/29/2018          )        Anesthesia Quick Evaluation

## 2020-01-22 NOTE — Op Note (Signed)
Patient: Charidy Cappelletti ([birth year], [id number])  Date of Surgery: 01/16/2020 - 01/22/2020   Preoperative Diagnosis: gallstone pancreatitis   Postoperative Diagnosis: gallstone pancreatitis   Surgical Procedure: LAPAROSCOPIC CHOLECYSTECTOMY WITH INTRAOPERATIVE CHOLANGIOGRAM:    Operative Team Members:  Surgeon(s) and Role:    * Kiora Hallberg, Hyman Hopes, MD - Primary   Anesthesiologist: Atilano Median, DO CRNA: Lelon Perla, CRNA   Anesthesia: General   Fluids:  No intake/output data recorded.  Complications: * No complications entered in OR log *  Drains:  none   Specimen:  ID Type Source Tests Collected by Time Destination  1 : GALLBLADDER GI Gallbladder SURGICAL PATHOLOGY Sibbie Flammia, Hyman Hopes, MD 01/22/2020 1323      Disposition:  PACU - hemodynamically stable.  Plan of Care: Continue care on the floor tonight on the medicine service with plans for discharge tomorrow if patient feels up to it.    Indications for Procedure: Daurice Ovando is a 39 y.o. female who presented with abdominal pain.  History, physical and imaging was concerning for gallstone pancreatitis.  Laparoscopic cholecystectomy was recommended for the patient once her pancreatitis clinically resolved.  The procedure itself, as well as the risks, benefits and alternatives were discussed with the patient.  Risks discussed included but were not limited to the risk of infection, bleeding, damage to nearby structures, need to convert to open procedure, incisional hernia, bile leak, common bile duct injury and the need for additional procedures or surgeries.  With this discussion complete and all questions answered the patient granted consent to proceed.  Findings: Inflamed gallbladder, unable to perform cholangiogram  Description of Procedure:   On the date stated above, the patient was taken to the operating room suite and placed in supine positioning.  Sequential compression devices were placed on the  lower extremities to prevent blood clots.  General endotracheal anesthesia was induced. Preoperative antibiotics (cefazolin) were given within 30 minutes of incision.  The patient's abdomen was prepped and draped in the usual sterile fashion.  A time-out was completed verifying the correct patient, procedure, positioning and equipment needed for the case.  We began by anesthetizing the skin with local anesthetic and then making a 5 mm incision just below the umbilicus.  We dissected through the subcutaneous tissues to the fascia.  The fascia was grasped and elevated using a Kocher clamp.  A Veress needle was inserted into the abdomen and the abdomen was insufflated to 15 mmHg.  A 5 mm trocar was inserted in this position under optical guidance and then the abdomen was inspected.  There was no trauma to the underlying viscera with initial trocar placement.  Any abnormal findings, other than inflammation in the right upper quadrant, are listed above in the findings section.  Three additional trocars were placed, one 12 mm trocar in the subxiphoid position, one 5 mm trocar in the midline epigastric area and one 78mm trocar in the right upper quadrant subcostally.  These were placed under direct vision without any trauma to the underlying viscera.    The patient was then placed in head up, left side down positioning.  The gallbladder was identified and dissected free from its attachments to the omentum allowing the duodenum to fall away.  The infundibulum of the gallbladder was dissected free working laterally to medially.  The cystic duct and cystic artery were dissected free from surrounding connective tissue.  The infundibulum of the gallbladder was dissected off the cystic plate.  A critical view of safety was obtained with  the cystic duct and cystic artery being cleared of connective tissues and clearly the only two structures entering into the gallbladder with the liver clearly visible behind.  One clip was  applied high on the cystic duct.  A small ductotomy was created below this using the endoscopic shears.  A cholangiogram catheter was introduced through the abdominal wall and into the cystic duct through this ductotomy.  The catheter was clipped into position.  The catheter was flushed to ensure no leakage around the clip.  We then removed the laparoscopic instruments and positioned the C-Arm to perform a cholangiogram.  The catheter was flushed with contrast under fluoroscopic visualization.  The contrast leaked around the catheter possibly due to a cystic duct obstruction so cholangiogram was not able to be obtained in the field was soiled with contrast.  At this point we aborted the cholangiogram and proceed with the cholecystectomy.  Clips were then applied to the cystic duct and cystic artery and then these structures were divided.  The gallbladder was dissected off the cystic plate, placed in an endocatch bag and removed from the 12 mm subxiphoid port site.  The clips were inspected and appeared effective.  The cystic plate was inspected and hemostasis was obtained using electrocautery.  A suction irrigator was used to clean the operative field.  Attention was turned to closure.  The 12 mm subxiphoid port site was closed using a 0-vicryl suture on a fascial suture passer.  The abdomen was desufflated.  The skin was closed using 4-0 monocryl and dermabond.  All sponge and needle counts were correct at the conclusion of the case.    Ivar Drape, MD General, Bariatric, & Minimally Invasive Surgery [facility], [place]

## 2020-01-22 NOTE — Progress Notes (Signed)
PROGRESS NOTE    Kristy Phillips  QHK:[id number] DOB: 04-03-1980 DOA: 01/16/2020 PCP: Pcp, No    Chief Complaint  Patient presents with  . Abdominal Pain    Brief Narrative:  39 year old lady , nursing 34 month old, presents to ED for abdominal pain associated with nausea and one episode of non bilious vomiting. CT abd and pelvis revealed peripancreatic fat stranding with peripancreatic free fluid suggestive of acute pancreatitis without evidence of necrosis. It also shows normal appearing liver as well as cholelithiasis without evidence of gb wall thickening or CBD dilation, no evidence of choledocholithiasis. GI consulted recommended conservative management with IV fluids and pain control followed by outpatient surgical consultation for possible cholecystectomy.    Assessment & Plan:   Acute Pancreatitis/cholelithiasis Thought to be secondary to biliary source.  She may have passed a gallstone.  Cholelithiasis was noted on CT scan.  Patient denies using alcohol.  Triglyceride levels normal.  Gastroenterology was consulted.  Patient was given IV fluids.  Pain medications were prescribed.  Occasional loose stools.  Given Imodium for same.  Lipase level improved.   Patient started improving.  Gastroenterology recommended outpatient referral to general surgery for cholecystectomy.  Discussed with general surgery on 12/10 to facilitate outpatient follow-up.  They spoke with the patient and offered her surgery in the hospital during this admission.  She agreed.  Plan is for surgery this afternoon.     Fever Likely due to pancreatitis.  UA unremarkable.  Chest x-ray was unremarkable as well.  Blood cultures have been negative.  Lower extremity Doppler study negative for DVT.  Fever appears to have subsided.  Most likely secondary to pancreatitis.  No infectious etiology found.     Microcytic anemia/iron deficiency TIBC noted to be 405.  Iron 33% saturation 8.  Ferritin noted to be 29.  B12 level  289 and folate 22.3.  Will need iron supplementation at discharge.  No active bleeding present.    Hypokalemia and hypomagnesemia Potassium normal.  Magnesium 1.8 yesterday.  DVT prophylaxis: Lovenox.  Code Status: Full code Family Communication: none at bedside. Disposition: Hopefully return home when cleared by general surgery  Status is: Inpatient  Remains inpatient appropriate because:IV treatments appropriate due to intensity of illness or inability to take PO and Inpatient level of care appropriate due to severity of illness persistent abdominal pain and nausea.    Dispo: The patient is from: Home              Anticipated d/c is to: Home              Anticipated d/c date is: 2 days              Patient currently is not medically stable to d/c.       Consultants:   Gastroenterology.   General surgery  Procedures: none.   Antimicrobials: None.    Subjective: Patient overall feels better.  Does not have any significant abdominal discomfort this morning.  No nausea vomiting.  Looking forward to her surgery.  Slightly anxious    Objective: Vitals:   01/21/20 1148 01/21/20 1200 01/21/20 1741 01/22/20 0500  BP: 120/72 139/79 115/80 109/67  Pulse: 94 85 93 79  Resp: 17 18 17 17   Temp: 98.6 F (37 C) 98.2 F (36.8 C) 98.6 F (37 C) 98.5 F (36.9 C)  TempSrc: Oral Oral Oral Oral  SpO2: 98%   98%  Weight:    73.5 kg    Intake/Output  Summary (Last 24 hours) at 01/22/2020 0926 Last data filed at 01/21/2020 1300 Gross per 24 hour  Intake 240 ml  Output --  Net 240 ml   Filed Weights   01/20/20 0527 01/21/20 0528 01/22/20 0500  Weight: 75.8 kg 74.9 kg 73.5 kg    Examination:  General appearance: Awake alert.  In no distress Resp: Clear to auscultation bilaterally.  Normal effort Cardio: S1-S2 is normal regular.  No S3-S4.  No rubs murmurs or bruit GI: Abdomen is soft.  No tenderness appreciated today.  Bowel sounds present normal.  No masses  organomegaly.   Extremities: No edema.  Full range of motion of lower extremities. Neurologic: Alert and oriented x3.  No focal neurological deficits.       Data Reviewed: I have personally reviewed following labs and imaging studies  CBC: Recent Labs  Lab 01/18/20 0308 01/19/20 0321 01/20/20 0250 01/21/20 0249 01/22/20 0212  WBC 11.2* 11.3* 10.0 9.0 7.9  HGB 10.9* 10.4* 9.9* 9.8* 9.9*  HCT 35.6* 31.6* 31.0* 31.8* 32.1*  MCV 78.9* 77.6* 78.3* 78.9* 78.7*  PLT 264 234 255 273 328    Basic Metabolic Panel: Recent Labs  Lab 01/17/20 0723 01/18/20 0308 01/19/20 0321 01/20/20 0250 01/21/20 0249 01/22/20 0212  NA 139 138 137 139 138 138  K 3.3* 3.8 3.5 3.7 3.6 3.7  CL 106 104 105 104 102 102  CO2 23 23 22 24 26 25   GLUCOSE 110* 94 111* 106* 109* 114*  BUN 6 5* 6 <5* 5* 7  CREATININE 0.48 0.56 0.54 0.56 0.54 0.48  CALCIUM 8.4* 8.6* 8.5* 8.6* 8.6* 8.8*  MG 1.6* 2.0  --  1.8  --   --     GFR: CrCl cannot be calculated (Unknown ideal weight.).  Liver Function Tests: Recent Labs  Lab 01/18/20 0308 01/19/20 0321 01/20/20 0250 01/21/20 0249 01/22/20 0212  AST 28 18 14* 11* 11*  ALT 45* 30 25 20 19   ALKPHOS 118 107 98 90 89  BILITOT 1.4* 1.4* 1.1 0.7 0.6  PROT 6.6 6.4* 6.4* 6.6 6.9  ALBUMIN 3.3* 3.2* 2.9* 2.9* 3.1*     Recent Results (from the past 240 hour(s))  Resp Panel by RT-PCR (Flu A&B, Covid) Nasopharyngeal Swab     Status: None   Collection Time: 01/16/20  9:31 PM   Specimen: Nasopharyngeal Swab; Nasopharyngeal(NP) swabs in vial transport medium  Result Value Ref Range Status   SARS Coronavirus 2 by RT PCR NEGATIVE NEGATIVE Final    Comment: (NOTE) SARS-CoV-2 target nucleic acids are NOT DETECTED.  The SARS-CoV-2 RNA is generally detectable in upper respiratory specimens during the acute phase of infection. The lowest concentration of SARS-CoV-2 viral copies this assay can detect is 138 copies/mL. A negative result does not preclude  SARS-Cov-2 infection and should not be used as the sole basis for treatment or other patient management decisions. A negative result may occur with  improper specimen collection/handling, submission of specimen other than nasopharyngeal swab, presence of viral mutation(s) within the areas targeted by this assay, and inadequate number of viral copies(<138 copies/mL). A negative result must be combined with clinical observations, patient history, and epidemiological information. The expected result is Negative.  Fact Sheet for Patients:  BloggerCourse.comhttps://www.fda.gov/media/152166/download  Fact Sheet for Healthcare Providers:  SeriousBroker.ithttps://www.fda.gov/media/152162/download  This test is no t yet approved or cleared by the [place] FDA and  has been authorized for detection and/or diagnosis of SARS-CoV-2 by FDA under an Emergency Use Authorization (EUA). This EUA  will remain  in effect (meaning this test can be used) for the duration of the COVID-19 declaration under Section 564(b)(1) of the Act, 21 U.S.C.section 360bbb-3(b)(1), unless the authorization is terminated  or revoked sooner.       Influenza A by PCR NEGATIVE NEGATIVE Final   Influenza B by PCR NEGATIVE NEGATIVE Final    Comment: (NOTE) The Xpert Xpress SARS-CoV-2/FLU/RSV plus assay is intended as an aid in the diagnosis of influenza from Nasopharyngeal swab specimens and should not be used as a sole basis for treatment. Nasal washings and aspirates are unacceptable for Xpert Xpress SARS-CoV-2/FLU/RSV testing.  Fact Sheet for Patients: BloggerCourse.com  Fact Sheet for Healthcare Providers: SeriousBroker.it  This test is not yet approved or cleared by the [place] FDA and has been authorized for detection and/or diagnosis of SARS-CoV-2 by FDA under an Emergency Use Authorization (EUA). This EUA will remain in effect (meaning this test can be used) for the duration of  the COVID-19 declaration under Section 564(b)(1) of the Act, 21 U.S.C. section 360bbb-3(b)(1), unless the authorization is terminated or revoked.  Performed at [facility], [address], [place], [place] [address]   Culture, Urine     Status: Abnormal   Collection Time: 01/19/20  2:15 AM   Specimen: Urine, Clean Catch  Result Value Ref Range Status   Specimen Description URINE, CLEAN CATCH  Final   Special Requests NONE  Final   Culture (A)  Final    2,000 COLONIES/mL GROUP B STREP(S.AGALACTIAE)ISOLATED TESTING AGAINST S. AGALACTIAE NOT ROUTINELY PERFORMED DUE TO PREDICTABILITY OF AMP/PEN/VAN SUSCEPTIBILITY. Performed at [facility], [address] [place], [place] [address]    Report Status 01/20/2020 FINAL  Final  Culture, blood (routine x 2)     Status: None (Preliminary result)   Collection Time: 01/19/20  3:22 AM   Specimen: BLOOD  Result Value Ref Range Status   Specimen Description BLOOD LEFT ARM  Final   Special Requests   Final    BOTTLES DRAWN AEROBIC AND ANAEROBIC Blood Culture adequate volume   Culture   Final    NO GROWTH 2 DAYS Performed at [facility], [address] [place], [place] [address]    Report Status PENDING  Incomplete  Culture, blood (routine x 2)     Status: None (Preliminary result)   Collection Time: 01/19/20  3:26 AM   Specimen: BLOOD  Result Value Ref Range Status   Specimen Description BLOOD LEFT HAND  Final   Special Requests   Final    BOTTLES DRAWN AEROBIC AND ANAEROBIC Blood Culture adequate volume   Culture   Final    NO GROWTH 2 DAYS Performed at [facility], [address], [place], [place] [address]    Report Status PENDING  Incomplete         Radiology Studies: VAS [place] LOWER EXTREMITY VENOUS (DVT)  Result Date: 01/20/2020  Lower Venous DVT Study Indications: Swelling.  Comparison Study: No previous Exam Performing Technologist: Clint Guy RVT  Examination Guidelines: A complete evaluation  includes B-mode imaging, spectral Doppler, color Doppler, and power Doppler as needed of all accessible portions of each vessel. Bilateral testing is considered an integral part of a complete examination. Limited examinations for reoccurring indications may be performed as noted. The reflux portion of the exam is performed with the patient in reverse Trendelenburg.  +---------+---------------+---------+-----------+----------+--------------+ RIGHT    CompressibilityPhasicitySpontaneityPropertiesThrombus Aging +---------+---------------+---------+-----------+----------+--------------+ CFV      Full  Yes      Yes                                 +---------+---------------+---------+-----------+----------+--------------+ SFJ      Full                                                        +---------+---------------+---------+-----------+----------+--------------+ FV Prox  Full                                                        +---------+---------------+---------+-----------+----------+--------------+ FV Mid   Full                                                        +---------+---------------+---------+-----------+----------+--------------+ FV DistalFull                                                        +---------+---------------+---------+-----------+----------+--------------+ PFV      Full                                                        +---------+---------------+---------+-----------+----------+--------------+ POP      Full           Yes      Yes                                 +---------+---------------+---------+-----------+----------+--------------+ PTV      Full                                                        +---------+---------------+---------+-----------+----------+--------------+ PERO     Full                                                         +---------+---------------+---------+-----------+----------+--------------+   +---------+---------------+---------+-----------+----------+--------------+ LEFT     CompressibilityPhasicitySpontaneityPropertiesThrombus Aging +---------+---------------+---------+-----------+----------+--------------+ CFV      Full           Yes      Yes                                 +---------+---------------+---------+-----------+----------+--------------+ SFJ  Full                                                        +---------+---------------+---------+-----------+----------+--------------+ FV Prox  Full                                                        +---------+---------------+---------+-----------+----------+--------------+ FV Mid   Full                                                        +---------+---------------+---------+-----------+----------+--------------+ FV DistalFull                                                        +---------+---------------+---------+-----------+----------+--------------+ PFV      Full                                                        +---------+---------------+---------+-----------+----------+--------------+ POP      Full           Yes      Yes                                 +---------+---------------+---------+-----------+----------+--------------+ PTV      Full                                                        +---------+---------------+---------+-----------+----------+--------------+ PERO     Full                                                        +---------+---------------+---------+-----------+----------+--------------+     Summary: RIGHT: - There is no evidence of deep vein thrombosis in the lower extremity.  - No cystic structure found in the popliteal fossa.  LEFT: - There is no evidence of deep vein thrombosis in the lower extremity.  - No cystic structure found in the popliteal fossa.   *See table(s) above for measurements and observations. Electronically signed by Sherald Hess MD on 01/20/2020 at 5:00:33 PM.    Final         Scheduled Meds: . enoxaparin (LOVENOX) injection  40 mg Subcutaneous Daily  . multivitamins with iron  1 tablet Oral Daily   Continuous Infusions: . lactated ringers 20 mL/hr at 01/20/20 1104  LOS: 6 days        Osvaldo Shipper, MD [facility]   To contact the attending provider between 7A-7P or the covering provider during after hours 7P-7A, please log into the web site [email or web address] and access using universal [place] password for that web site. If you do not have the password, please call the hospital operator.  01/22/2020, 9:26 AM

## 2020-01-22 NOTE — Progress Notes (Signed)
Patient returned to room from PACU, a/ox4, denied pain, VSS. Orders reviewed. Surgical site CDI. Will continue to monitor.

## 2020-01-22 NOTE — Progress Notes (Signed)
Used video interpreter [phone] for pre-op interview.

## 2020-01-22 NOTE — Progress Notes (Signed)
Progress Note: General Surgery Service   Chief Complaint/Subjective: Her abdominal pain is improved.  She is anticipating having surgery today.  I discussed the surgery with her using the Spanish translator.  Objective: Vital signs in last 24 hours: Temp:  [98.5 F (36.9 C)-98.6 F (37 C)] 98.5 F (36.9 C) (12/11 0500) Pulse Rate:  [79-93] 79 (12/11 0500) Resp:  [17] 17 (12/11 0500) BP: (109-115)/(67-80) 109/67 (12/11 0500) SpO2:  [98 %] 98 % (12/11 0500) Weight:  [73.5 kg] 73.5 kg (12/11 0500) Last BM Date: 01/21/20  Intake/Output from previous day: 12/10 0701 - 12/11 0700 In: 480 [P.O.:480] Out: -  Intake/Output this shift: No intake/output data recorded.  Gen: No acute distress resting in the preoperative bed  Resp: Bilateral breath  Card: Regular rate and rhythm  Abd: Soft, mild tenderness in the right upper quadrant no peritoneal signs  Lab Results: CBC  Recent Labs    01/21/20 0249 01/22/20 0212  WBC 9.0 7.9  HGB 9.8* 9.9*  HCT 31.8* 32.1*  PLT 273 328   BMET Recent Labs    01/21/20 0249 01/22/20 0212  NA 138 138  K 3.6 3.7  CL 102 102  CO2 26 25  GLUCOSE 109* 114*  BUN 5* 7  CREATININE 0.54 0.48  CALCIUM 8.6* 8.8*   PT/INR No results for input(s): LABPROT, INR in the last 72 hours. ABG No results for input(s): PHART, HCO3 in the last 72 hours.  Invalid input(s): PCO2, PO2  Anti-infectives: Anti-infectives (From admission, onward)   Start     Dose/Rate Route Frequency Ordered Stop   01/22/20 0600  ceFAZolin (ANCEF) IVPB 2g/100 mL premix        2 g 200 mL/hr over 30 Minutes Intravenous On call to O.R. 01/21/20 2019 01/22/20 0713      Medications: Scheduled Meds:  chlorhexidine  15 mL Mouth/Throat Once   chlorhexidine       [MAR Hold] enoxaparin (LOVENOX) injection  40 mg Subcutaneous Daily   [MAR Hold] multivitamins with iron  1 tablet Oral Daily   Continuous Infusions:  lactated ringers 20 mL/hr at 01/20/20 1104   lactated  ringers     PRN Meds:.[MAR Hold] acetaminophen, [MAR Hold] fentaNYL (SUBLIMAZE) injection, [MAR Hold] ibuprofen, [MAR Hold] loperamide, [MAR Hold] ondansetron (ZOFRAN) IV, [MAR Hold] oxyCODONE  Assessment/Plan: Kristy Phillips is a 39 year old female with gallstone pancreatitis which is clinically resolved.  I recommended the procedure of laparoscopic cholecystectomy with intraoperative cholangiogram.  The procedure itself as well as its risks, benefits, and alternatives were discussed with the patient.  This was done using the Engineer, structural.  The risks discussed included but were not limited to the risk of infection, bleeding, damage nearby structures, bile duct injury, bile leak and incisional hernia.  With the discussion complete and all questions answered the patient granted consent to proceed.  We will proceed to the operating room once time is available.   LOS: 6 days   Kristy Ore, MD [phone] [facility]

## 2020-01-22 NOTE — Transfer of Care (Signed)
Immediate Anesthesia Transfer of Care Note  Patient: Kristy Phillips  Procedure(s) Performed: LAPAROSCOPIC CHOLECYSTECTOMY WITH INTRAOPERATIVE CHOLANGIOGRAM (N/A Abdomen)  Patient Location: PACU  Anesthesia Type:General  Level of Consciousness: awake, alert  and oriented  Airway & Oxygen Therapy: Patient Spontanous Breathing  Post-op Assessment: Report given to RN and Post -op Vital signs reviewed and stable  Post vital signs: Reviewed and stable  Last Vitals:  Vitals Value Taken Time  BP 132/70 01/22/20 1345  Temp 36.7 C 01/22/20 1345  Pulse 87 01/22/20 1350  Resp 19 01/22/20 1350  SpO2 98 % 01/22/20 1350  Vitals shown include unvalidated device data.  Last Pain:  Vitals:   01/22/20 0500  TempSrc: Oral  PainSc:       Patients Stated Pain Goal: 0 (01/22/20 0200)  Complications: No complications documented.

## 2020-01-22 NOTE — Anesthesia Procedure Notes (Signed)
Procedure Name: Intubation Date/Time: 01/22/2020 12:48 PM Performed by: Dorthea Cove, CRNA Pre-anesthesia Checklist: Patient identified, Emergency Drugs available, Suction available and Patient being monitored Patient Re-evaluated:Patient Re-evaluated prior to induction Oxygen Delivery Method: Circle system utilized Preoxygenation: Pre-oxygenation with 100% oxygen Induction Type: IV induction Ventilation: Mask ventilation without difficulty Laryngoscope Size: Mac and 3 Grade View: Grade I Tube type: Oral Tube size: 7.0 mm Number of attempts: 1 Airway Equipment and Method: Stylet and Oral airway Placement Confirmation: ETT inserted through vocal cords under direct vision,  positive ETCO2 and breath sounds checked- equal and bilateral Secured at: 21 cm Tube secured with: Tape Dental Injury: Teeth and Oropharynx as per pre-operative assessment

## 2020-01-23 ENCOUNTER — Encounter (HOSPITAL_COMMUNITY): Payer: Self-pay | Admitting: Surgery

## 2020-01-23 LAB — CBC
HCT: 31.8 % — ABNORMAL LOW (ref 36.0–46.0)
Hemoglobin: 9.9 g/dL — ABNORMAL LOW (ref 12.0–15.0)
MCH: 24.4 pg — ABNORMAL LOW (ref 26.0–34.0)
MCHC: 31.1 g/dL (ref 30.0–36.0)
MCV: 78.3 fL — ABNORMAL LOW (ref 80.0–100.0)
Platelets: 383 10*3/uL (ref 150–400)
RBC: 4.06 MIL/uL (ref 3.87–5.11)
RDW: 14.4 % (ref 11.5–15.5)
WBC: 7.9 10*3/uL (ref 4.0–10.5)
nRBC: 0 % (ref 0.0–0.2)

## 2020-01-23 LAB — COMPREHENSIVE METABOLIC PANEL
ALT: 32 U/L (ref 0–44)
AST: 45 U/L — ABNORMAL HIGH (ref 15–41)
Albumin: 3 g/dL — ABNORMAL LOW (ref 3.5–5.0)
Alkaline Phosphatase: 92 U/L (ref 38–126)
Anion gap: 10 (ref 5–15)
BUN: 7 mg/dL (ref 6–20)
CO2: 24 mmol/L (ref 22–32)
Calcium: 9.1 mg/dL (ref 8.9–10.3)
Chloride: 104 mmol/L (ref 98–111)
Creatinine, Ser: 0.52 mg/dL (ref 0.44–1.00)
GFR, Estimated: >60 mL/min (ref >60)
Glucose, Bld: 153 mg/dL — ABNORMAL HIGH (ref 70–99)
Potassium: 3.9 mmol/L (ref 3.5–5.1)
Sodium: 138 mmol/L (ref 135–145)
Total Bilirubin: 0.5 mg/dL (ref 0.3–1.2)
Total Protein: 6.9 g/dL (ref 6.5–8.1)

## 2020-01-23 NOTE — Progress Notes (Signed)
1 Day Post-Op  Subjective: CC: Doing well. Some soreness around incisions. No n/v. Tolerating diet. Mobilizing. Voiding.   Objective: Vital signs in last 24 hours: Temp:  [98 F (36.7 C)-98.7 F (37.1 C)] 98.7 F (37.1 C) (12/12 0348) Pulse Rate:  [75-91] 75 (12/12 0348) Resp:  [16-23] 18 (12/12 0348) BP: (110-134)/(69-91) 110/73 (12/12 0348) SpO2:  [96 %-98 %] 97 % (12/12 0348) Weight:  [74.9 kg] 74.9 kg (12/12 0348) Last BM Date: 01/21/20  Intake/Output from previous day: 12/11 0701 - 12/12 0700 In: 1600 [P.O.:600; I.V.:1000] Out: 5 [Blood:5] Intake/Output this shift: No intake/output data recorded.  PE: Gen:  Alert, NAD, pleasant HEENT: EOM's intact, pupils equal and round Pulm: Normal rate and effort Abd: Soft, ND, appropriately tender around laparoscopic incisions, +BS, Incisions with glue intact appears well and are without drainage, bleeding, or signs of infection Ext:  No LE edema  Psych: A&Ox3  Skin: no rashes noted, warm and dry   Lab Results:  Recent Labs    01/22/20 0212 01/23/20 0251  WBC 7.9 7.9  HGB 9.9* 9.9*  HCT 32.1* 31.8*  PLT 328 383   BMET Recent Labs    01/22/20 0212 01/23/20 0251  NA 138 138  K 3.7 3.9  CL 102 104  CO2 25 24  GLUCOSE 114* 153*  BUN 7 7  CREATININE 0.48 0.52  CALCIUM 8.8* 9.1   PT/INR No results for input(s): LABPROT, INR in the last 72 hours. CMP     Component Value Date/Time   NA 138 01/23/2020 0251   K 3.9 01/23/2020 0251   CL 104 01/23/2020 0251   CO2 24 01/23/2020 0251   GLUCOSE 153 (H) 01/23/2020 0251   BUN 7 01/23/2020 0251   CREATININE 0.52 01/23/2020 0251   CALCIUM 9.1 01/23/2020 0251   PROT 6.9 01/23/2020 0251   ALBUMIN 3.0 (L) 01/23/2020 0251   AST 45 (H) 01/23/2020 0251   ALT 32 01/23/2020 0251   ALKPHOS 92 01/23/2020 0251   BILITOT 0.5 01/23/2020 0251   GFRNONAA >60 01/23/2020 0251   GFRAA >60 08/29/2018 1709   Lipase     Component Value Date/Time   LIPASE 35 01/21/2020 0249        Studies/Results: DG Cholangiogram Operative  Result Date: 01/22/2020 CLINICAL DATA:  Cholelithiasis EXAM: INTRAOPERATIVE CHOLANGIOGRAM TECHNIQUE: Cholangiographic image from the C-arm fluoroscopic device submitted for interpretation post-operatively. Please see the procedural report for the amount of contrast and the fluoroscopy time utilized. COMPARISON:  CT of 01/16/2020 FINDINGS: Single image demonstrates subhepatic extravasation of contrast in the region of the gallbladder fossa. No visualization of the biliary tree or CBD. IMPRESSION: Limited study.  Choledocholithiasis not evaluated. Electronically Signed   By: Corlis Leak M.D.   On: 01/22/2020 16:04    Anti-infectives: Anti-infectives (From admission, onward)   Start     Dose/Rate Route Frequency Ordered Stop   01/22/20 0600  ceFAZolin (ANCEF) IVPB 2g/100 mL premix        2 g 200 mL/hr over 30 Minutes Intravenous On call to O.R. 01/21/20 2019 01/22/20 0713       Assessment/Plan Gallstone Pancreatitis  POD #1, s/p Laparoscopic Cholecystectomy with IOC - Dr. Royanne Foots - 01/22/2020 - IOC limited study. T. Bili 0.5 this AM - On POD 1, the patient was voiding well, tolerating diet, ambulating well, pain well controlled, vital signs stable, incisions c/d/i and felt stable for discharge home from our standpoint. I have sent a message to the office to arrange  follow up. I have reached out to [facility] to let them know she is okay for d/c.  FEN - Reg VTE - SCDs, Loveonx ID - Ancef periop   LOS: 7 days    Jacinto Halim , [facility] 01/23/2020, 9:30 AM Please see Amion for pager number during day hours 7:00am-4:30pm

## 2020-01-23 NOTE — Discharge Instructions (Signed)
CIRUGIA LAPAROSCOPICA: INSTRUCCIONES DE POST OPERATORIO. ° °Revise siempre los documentos que le entreguen en el lugar donde se ha hecho la cirugia. ° °SI USTED NECESITA DOCUMENTOS DE INCAPACIDAD (DISABLE) O DE PERMISO FAMILAR (FAMILY LEAVE) NECESITA TRAERLOS A LA OFICINA PARA QUE SEAN PROCESADOS. °NO  SE LOS DE A SU DOCTOR. °1. A su alta del hospital se le dara una receta para controlar el dolor. Tomela como ha sido recetada, si la necesita. Si no la necesita puede tomar, Acetaminofen (Tylenol) o Ibuprofen (Advil) para aliviar dolor moderado. °2. Continue tomando el resto de sus medicinas. °3. Si necesita rellenar la receta, llame a la farmacia. ellos contactan a nuestra oficina pidiendo autorizacion. Este tipo de receta no pueden ser rellenadas despues de las  5pm o durante los fines de semana. °4. Con relacion a la dieta: debe ser ligera los primeros dias despues que llege a la casa. Ejemplo: sopas y galleticas. Tome bastante liquido esos dias. °5. La mayoria de los pacientes padecen de inflamacion y cambio de coloracion de la piel alrededor de las incisiones. esto toma dias en resolver.  pnerse una bolsa de hielo en el area affectada ayuda..  °6. Es comun tambien tener un poco de estrenimiento si esta tomado medicinas para el dolor. incremente la cantidad de liquidos a tomar y puede tomar (Colace) esto previene el problema. Si ya tiene estrenimiento, es decir no ha defecado en 48 horas, puede tomar un laxativo (Milk of Magnesia or Miralax) uselo como el paquete le explica. °7.  A menos que se le diga algo diferente. Remueva el bendaje a las 24-48 horas despues dela cirugia. y puede banarse en la ducha sin ningun problema. usted puede tener steri-strips (pequenas curitas transparentes en la piel puesta encima de la incision)  Estas banditas strips should be left on the skin for 7-10 days.   Si su cirujano puso pegamento encima de la incision usted puede banarse bajo la ducha en 24 horas. Este pegamento empezara a  caerse en las proximas 2-3 semanas. Si le pusieron suturas o presillas (grapos) estos seran quitados en su proxima cita en la oficina. . °a. ACTIVIDADES:  Puede hacer actividad ligera.  Como caminar , subir escaleras y poco a poco irlas incrementando tanto como las tolere. Puede tener relaciones sexuales cuando sea comfortable. No carge objetos pesados o haga esfuerzos que no sean aprovados por su doctor. °b. Puede manejar en cuanto no esta tomando medicamentos fuertes (narcoticos) para el dolor, pueda abrochar confortablemente el cinturon de seguridad, y pueda maniobrar y usar los pedales de su vehiculo con seguridad. °c. PUEDE REGRESAR A TRABAJAR  °8. Debe ver a su doctor para una cita de seguimiento en 2-3 semanas despues de la cirugia.  °9. OTRAS ISNSTRUCCIONES:___________________________________________________________________________________ °CUANDO LLAMAR A SU MEDICO: °1. FIEBRE mayor de  101.0 °2. No produccion de orina. °3. Sangramiento continue de la herida °4. Incremento de dolor, enrojecimientio o drenaje de la herida (incision) °5. Incremento de dolor abdominal. ° °The clinic staff is available to answer your questions during regular business hours.  Please don’t hesitate to call and ask to speak to one of the nurses for clinical concerns.  If you have a medical emergency, go to the nearest emergency room or call 911.  A surgeon from [facility] is always on call at the hospital. °[address], [address], [place], [place]  [address] ? [address] [address], [place], [place]   [address] °[phone] ? [phone] ? FAX [phone] °Web site: [email or web address] ° ° °  Plan de alimentacin para problemas de vescula biliar Gallbladder Eating Plan Si tiene una afeccin de la vescula biliar, puede tener problemas para digerir las grasas. Consumir una dieta con bajo contenido de grasas puede [employer] sntomas, y puede ser beneficiosa antes y despus de [place] de  extraccin de vescula biliar (colecistectoma). El mdico puede recomendarle que trabaje con un especialista en dietas y alimentacin (nutricionista) para que lo ayude a reducir la cantidad de grasas en su dieta. Consejos para seguir este plan [place] generales  Limite el consumo de grasas a menos del 30% del total de caloras diarias. Si usted ingiere alrededor de 1800 caloras diarias, esto es menos de 60 gramos (g) de Automotive engineer.  La grasa es una parte importante de una dieta saludable. Consumir una dieta con bajo contenido de grasas puede dificultar mantener un peso corporal saludable. Pregunte a su nutricionista qu cantidad de grasas, caloras y otros nutrientes necesita diariamente.  Haga comidas pequeas y frecuentes Freight forwarder de tres comidas abundantes.  Beba de 8 a 10 vasos de lquido por [employer]. Beba suficiente lquido como para mantener la orina clara o de color amarillo plido.  Limite el consumo de alcohol a no ms de por da si es mujer y no est [place], y por da si es hombre. Una medida equivale a 12oz ( ) de cerveza, 5oz ( ) de vino o 1oz (68ml) de bebidas alcohlicas de alta graduacin. Lea las etiquetas de los alimentos  Consulte la informacin nutricional en las etiquetas de los alimentos para conocer la cantidad de grasas por porcin. Elija alimentos con menos de 3 gramos de grasas por porcin. De compras  Elija alimentos saludables sin grasas o con bajo contenido de [place]. Busque las palabras sin grasa, bajo en grasas o con bajo contenido de [place].  Evite comprar alimentos procesados o envasados. Coccin  Para cocinar opte por mtodos con bajo contenido de grasa, como hornear, hervir, Software engineer y Transport planner.  Cocine con pequeas cantidades de grasas saludables, como aceite de [place], aceite de semilla de [place], aceite de canola o [place]. Qu alimentos se recomiendan?   Todas las frutas y verduras  frescas, congeladas o enlatadas.  Cereales integrales.  Leche y yogur semidescremados y descremados.  Vita [place], aves sin piel, pescado, huevos y legumbres.  Suplementos proteicos con bajo contenido de grasas, en polvo o lquidos.  Hierbas y especias. Qu alimentos no se recomiendan?  Alimentos muy grasos. Entre estos se incluyen productos panificados, comida rpida, cortes de carne con grasa, helados, pan francs, rosquillas dulces, pizza, pan de queso, alimentos cubiertos con [place], salsas con crema o queso.  Comidas fritas. Se incluyen papas fritas, tempura, pescado rebozado, milanesas de pollo, panes fritos y dulces.  Alimentos con [employer].  Alimentos que causan gases o meteorismo. Resumen  Una dieta de bajo contenido graso puede ser beneficiosa si tiene una afeccin de la vescula biliar o puede hacerla antes y despus de someterse a una ciruga de vescula.  Limite el consumo de grasas a menos del 30% del total de caloras diarias. Esto es casi 60 gramos de grasa si usted ingiere 1800 caloras diarias.  Haga comidas pequeas y frecuentes Freight forwarder de tres comidas abundantes. Esta informacin no tiene Theme park manager el consejo del mdico. Asegrese de hacerle al mdico cualquier pregunta que tenga. Document Revised: 09/03/2016 Document Reviewed: 09/03/2016 Elsevier Patient Education  2020 Elsevier Inc.   Pancreatitis aguda Acute Pancreatitis  La pancreatitis aguda ocurre cuando el pncreas se hincha. El pncreas es una glndula grande del cuerpo que ayuda a Psychologist, clinical. Tambin produce enzimas que ayudan a Retail buyer. Esta afeccin puede durar [employer] y causar problemas graves. Los pulmones, el corazn y los riones pueden dejar de funcionar. Cules son las causas? Estas pueden incluir las siguientes:  Consumo excesivo de alcohol.  Abuso de drogas.  Clculos biliares.  Un tumor en el pncreas. Otras  causas son:  Algunos medicamentos.  Algunos productos qumicos.  Diabetes.  Infeccin.  Dao producido por un accidente.  La sustancia venenosa (veneno) de una picadura de escorpin.  Una ciurga en el vientre (abdominal).  El ataque por parte del sistema de defensa del cuerpo (sistema inmunitario) al pncreas (pancreatitis autoinmunitaria).  Genes que se transmiten de padres a hijos (hereditarios). En algunos casos, se desconoce la causa. Cules son los signos o los sntomas?  Dolor en la parte superior del vientre que puede sentirse en la espalda. El dolor puede ser muy intenso.  Hinchazn en el estmago.  Malestar estomacal (nuseas) y vmitos.  Grant Ruts. Cmo se trata? Es probable que daba Engineer, maintenance hospital. El tratamiento puede incluir:  Tomar analgsicos.  Lquidos a travs de un tubo (catter) intravenoso.  Colocar un tubo en el estmago para extraer el contenido del [place]. Esto puede ayudarlo a Materials engineer.  No comer durante 3 o 4 das.  Antibiticos en el caso de una infeccin.  Tratar cualquier otro problema que pueda ser la causa.  Medicamentos con corticoesteroides, si la causa de su problema es que su sistema de defensa ataca los tejidos propios del cuerpo.  Kristy Phillips. Siga estas instrucciones en su casa: Comida y bebida   Siga las indicaciones del mdico sobre qu comer y Product manager.  Consuma alimentos que no tengan [place].  Consuma pequeas cantidades de comida con ms frecuencia. No coma en gran cantidad.  Beba suficiente lquido para Radio producer pis (la orina) de color amarillo plido.  No consuma alcohol si esta fue la causa de su afeccin. Medicamentos  [employer] de venta libre y los recetados solamente como se lo haya indicado el mdico.  Consulte a su mdico si el medicamento que le recetaron: ? Hace que sea necesario que evite conducir o usar maquinaria pesada. ? Puede causarle dificultad para  defecar (estreimiento). Es posible que deba tomar medidas para prevenir o tratar los problemas para defecar:  Tome medicamentos recetados o de Sales promotion account executive.  Coma alimentos ricos en fibra. Entre ellos, frijoles, cereales integrales y frutas y verduras frescas.  Limite los alimentos con alto contenido de [place] y International aid/development worker. Estos incluyen alimentos fritos o dulces. Indicaciones generales  No consuma ningn producto que contenga nicotina o tabaco, como cigarrillos, cigarrillos electrnicos y tabaco de Theatre manager. Si necesita ayuda para dejar de consumir, consulte al mdico.  Descanse mucho.  Controle su nivel de Production assistant, radio en su casa tal como le indic el mdico.  Concurra a todas las visitas de seguimiento como se lo haya indicado el mdico. Esto es importante. Comunquese con un mdico si:  No mejora en el tiempo en que se esperaba.  Aparecen nuevos sntomas.  Sus sntomas empeoran.  Tiene dolor o debilidad que dura [place].  Siente Agricultural consultant.  Comienza a mejorar y luego tiene dolor nuevamente.  Tiene fiebre. Solicite ayuda inmediatamente si:  No puede comer ni retener lquidos.  El dolor es [place]  intenso.  La piel o la parte blanca de los ojos se tornan de color amarillento.  El vientre se le hincha de [place] sbita.  Vomita.  Se siente mareado o se desvanece (se desmaya).  Su nivel de azcar en la sangre es alto (ms de 300mg /dl). Resumen  La pancreatitis aguda ocurre cuando el pncreas se hincha.  Normalmente, esta afeccin es causada por el consumo excesivo de alcohol o drogas o la presencia de clculos biliares.  Es probable que daba hospital para Engineer, maintenance. Esta informacin no tiene Veterinary surgeon el consejo del mdico. Asegrese de hacerle al mdico cualquier pregunta que tenga. Document Revised: 12/25/2017 Document Reviewed: 12/25/2017 Elsevier Patient Education  2020 12/27/2017.

## 2020-01-23 NOTE — Progress Notes (Signed)
RN gave pt discharge instructions with the help of Kristy Phillips interpretor. Pt stated understanding IV has been removed and the patient is waiting for her husband that's her ride

## 2020-01-23 NOTE — Discharge Summary (Signed)
[facility]  Physician Discharge Summary   Patient ID: Kristy BusingGloria Kitchens MRN: [id number] DOB/AGE: 06-16-1980 39 y.o.  Admit date: 01/16/2020 Discharge date: 01/24/2020  PCP: Pcp, No  DISCHARGE DIAGNOSES:  Acute biliary pancreatitis Iron deficiency anemia  RECOMMENDATIONS FOR OUTPATIENT FOLLOW UP: 1. Follow-up with general surgery   Home Health: None Equipment/Devices: None  CODE STATUS: Full code  DISCHARGE CONDITION: fair  Diet recommendation: Low-fat  INITIAL HISTORY: 39 year old lady , nursing 6510 month old, presents to ED for abdominal pain associated with nausea and one episode of non bilious vomiting. CT abd and pelvis revealed peripancreatic fat stranding with peripancreatic free fluid suggestive of acute pancreatitis without evidence of necrosis. It also shows normal appearing liver as well as cholelithiasis without evidence of gb wall thickening or CBD dilation, no evidence of choledocholithiasis.   Consultations:  [facility]  General surgery  Procedures:  Laparoscopic cholecystectomy with intraoperative cholangiogram    HOSPITAL COURSE:   Acute Pancreatitis/cholelithiasis Thought to be secondary to biliary source.  She may have passed a gallstone.  Cholelithiasis was noted on CT scan.  Patient denies using alcohol.  Triglyceride levels normal.  Gastroenterology was consulted.  Patient was given IV fluids.  Pain medications were prescribed.  Occasional loose stools.  Given Imodium for same.  Lipase level improved.   Patient started improving.  Gastroenterology recommended outpatient referral to general surgery for cholecystectomy.  Discussed with general surgery on 12/10 to facilitate outpatient follow-up.  They spoke with the patient and offered her surgery in the hospital during this admission.  She agreed.  Patient subsequently underwent laparoscopic cholecystectomy yesterday.  Doing well postoperatively.  Tolerating her diet.  Cleared by  general surgery for discharge.    Fever Likely due to pancreatitis.  UA unremarkable.  Chest x-ray was unremarkable as well.  Blood cultures have been negative.  Lower extremity Doppler study negative for DVT.  Fever appears to have subsided.  Most likely secondary to pancreatitis.  No infectious etiology found.     Microcytic anemia/iron deficiency TIBC noted to be 405.  Iron 33% saturation 8.  Ferritin noted to be 29.  B12 level 289 and folate 22.3.  Will prescribe iron tablets at discharge.  Hypokalemia and hypomagnesemia Resolved  Obesity Estimated body mass index is 31.19 kg/m as calculated from the following:   Height as of 03/07/19: 5\' 1"  (1.549 m).   Weight as of this encounter: 74.9 kg.   Patient remains stable.  Cleared by general surgery.  Okay for discharge home today.  PERTINENT LABS:  The results of significant diagnostics from this hospitalization (including imaging, microbiology, ancillary and laboratory) are listed below for reference.    Microbiology: Recent Results (from the past 240 hour(s))  Resp Panel by RT-PCR (Flu A&B, Covid) Nasopharyngeal Swab     Status: None   Collection Time: 01/16/20  9:31 PM   Specimen: Nasopharyngeal Swab; Nasopharyngeal(NP) swabs in vial transport medium  Result Value Ref Range Status   SARS Coronavirus 2 by RT PCR NEGATIVE NEGATIVE Final    Comment: (NOTE) SARS-CoV-2 target nucleic acids are NOT DETECTED.  The SARS-CoV-2 RNA is generally detectable in upper respiratory specimens during the acute phase of infection. The lowest concentration of SARS-CoV-2 viral copies this assay can detect is 138 copies/mL. A negative result does not preclude SARS-Cov-2 infection and should not be used as the sole basis for treatment or other patient management decisions. A negative result may occur with  improper specimen collection/handling, submission of specimen other  than nasopharyngeal swab, presence of viral mutation(s) within  the areas targeted by this assay, and inadequate number of viral copies(<138 copies/mL). A negative result must be combined with clinical observations, patient history, and epidemiological information. The expected result is Negative.  Fact Sheet for Patients:  BloggerCourse.com  Fact Sheet for Healthcare Providers:  SeriousBroker.it  This test is no t yet approved or cleared by the [place] FDA and  has been authorized for detection and/or diagnosis of SARS-CoV-2 by FDA under an Emergency Use Authorization (EUA). This EUA will remain  in effect (meaning this test can be used) for the duration of the COVID-19 declaration under Section 564(b)(1) of the Act, 21 U.S.C.section 360bbb-3(b)(1), unless the authorization is terminated  or revoked sooner.       Influenza A by PCR NEGATIVE NEGATIVE Final   Influenza B by PCR NEGATIVE NEGATIVE Final    Comment: (NOTE) The Xpert Xpress SARS-CoV-2/FLU/RSV plus assay is intended as an aid in the diagnosis of influenza from Nasopharyngeal swab specimens and should not be used as a sole basis for treatment. Nasal washings and aspirates are unacceptable for Xpert Xpress SARS-CoV-2/FLU/RSV testing.  Fact Sheet for Patients: BloggerCourse.com  Fact Sheet for Healthcare Providers: SeriousBroker.it  This test is not yet approved or cleared by the [place] FDA and has been authorized for detection and/or diagnosis of SARS-CoV-2 by FDA under an Emergency Use Authorization (EUA). This EUA will remain in effect (meaning this test can be used) for the duration of the COVID-19 declaration under Section 564(b)(1) of the Act, 21 U.S.C. section 360bbb-3(b)(1), unless the authorization is terminated or revoked.  Performed at [facility], [address] [place], [place] [address]   Culture, Urine     Status: Abnormal   Collection Time:  01/19/20  2:15 AM   Specimen: Urine, Clean Catch  Result Value Ref Range Status   Specimen Description URINE, CLEAN CATCH  Final   Special Requests NONE  Final   Culture (A)  Final    2,000 COLONIES/mL GROUP B STREP(S.AGALACTIAE)ISOLATED TESTING AGAINST S. AGALACTIAE NOT ROUTINELY PERFORMED DUE TO PREDICTABILITY OF AMP/PEN/VAN SUSCEPTIBILITY. Performed at [facility], [address], [place], [place] [address]    Report Status 01/20/2020 FINAL  Final  Culture, blood (routine x 2)     Status: None   Collection Time: 01/19/20  3:22 AM   Specimen: BLOOD  Result Value Ref Range Status   Specimen Description BLOOD LEFT ARM  Final   Special Requests   Final    BOTTLES DRAWN AEROBIC AND ANAEROBIC Blood Culture adequate volume   Culture   Final    NO GROWTH 5 DAYS Performed at [facility], [address], [place], [place] [address]    Report Status 01/24/2020 FINAL  Final  Culture, blood (routine x 2)     Status: None   Collection Time: 01/19/20  3:26 AM   Specimen: BLOOD  Result Value Ref Range Status   Specimen Description BLOOD LEFT HAND  Final   Special Requests   Final    BOTTLES DRAWN AEROBIC AND ANAEROBIC Blood Culture adequate volume   Culture   Final    NO GROWTH 5 DAYS Performed at [facility], [address], [place], [place] [address]    Report Status 01/24/2020 FINAL  Final     Labs:  COVID-19 Labs   Lab Results  Component Value Date   SARSCOV2NAA NEGATIVE 01/16/2020   SARSCOV2NAA NEGATIVE 03/07/2019  Basic Metabolic Panel: Recent Labs  Lab 01/18/20 0308 01/19/20 0321 01/20/20 0250 01/21/20 0249 01/22/20 0212 01/23/20 0251  NA 138 137 139 138 138 138  K 3.8 3.5 3.7 3.6 3.7 3.9  CL 104 105 104 102 102 104  CO2 GLUCOSE 94 111* 106* 109* 114* 153*  BUN 5* 6 <5* 5* 7 7  CREATININE 0.56 0.54 0.56 0.54 0.48 0.52  CALCIUM 8.6* 8.5* 8.6* 8.6* 8.8* 9.1  MG 2.0  --  1.8  --   --   --    Liver Function  Tests: Recent Labs  Lab 01/19/20 0321 01/20/20 0250 01/21/20 0249 01/22/20 0212 01/23/20 0251  AST 18 14* 11* 11* 45*  ALT 32  ALKPHOS 107 98 90 89 92  BILITOT 1.4* 1.1 0.7 0.6 0.5  PROT 6.4* 6.4* 6.6 6.9 6.9  ALBUMIN 3.2* 2.9* 2.9* 3.1* 3.0*   Recent Labs  Lab 01/17/20 1946 01/18/20 0308 01/19/20 0321 01/21/20 0249  LIPASE 808* 393* 120* 35   CBC: Recent Labs  Lab 01/19/20 0321 01/20/20 0250 01/21/20 0249 01/22/20 0212 01/23/20 0251  WBC 11.3* 10.0 9.0 7.9 7.9  HGB 10.4* 9.9* 9.8* 9.9* 9.9*  HCT 31.6* 31.0* 31.8* 32.1* 31.8*  MCV 77.6* 78.3* 78.9* 78.7* 78.3*  PLT 234 255 273 328 383     IMAGING STUDIES DG Chest 1 View  Result Date: 01/19/2020 CLINICAL DATA:  Pneumonia EXAM: CHEST  1 VIEW COMPARISON:  None. FINDINGS: The heart size and mediastinal contours are within normal limits. Both lungs are clear. The visualized skeletal structures are unremarkable. IMPRESSION: No active disease. Electronically Signed   By: Jonna Clark M.D.   On: 01/19/2020 02:11   DG Cholangiogram Operative  Result Date: 01/22/2020 CLINICAL DATA:  Cholelithiasis EXAM: INTRAOPERATIVE CHOLANGIOGRAM TECHNIQUE: Cholangiographic image from the C-arm fluoroscopic device submitted for interpretation post-operatively. Please see the procedural report for the amount of contrast and the fluoroscopy time utilized. COMPARISON:  CT of 01/16/2020 FINDINGS: Single image demonstrates subhepatic extravasation of contrast in the region of the gallbladder fossa. No visualization of the biliary tree or CBD. IMPRESSION: Limited study.  Choledocholithiasis not evaluated. Electronically Signed   By: Corlis Leak M.D.   On: 01/22/2020 16:04   CT ABDOMEN PELVIS W CONTRAST  Result Date: 01/16/2020 CLINICAL DATA:  Pancreatitis.  Nausea and vomiting. EXAM: CT ABDOMEN AND PELVIS WITH CONTRAST TECHNIQUE: Multidetector CT imaging of the abdomen and pelvis was performed using the standard protocol following  bolus administration of intravenous contrast. CONTRAST:  OMNIPAQUE IOHEXOL 300 MG/ML  SOLN COMPARISON:  None. FINDINGS: Lower chest: The lung bases are clear. The heart size is normal. Hepatobiliary: The liver is normal. Cholelithiasis without acute inflammation.There is no biliary ductal dilation. Pancreas: There is peripancreatic fat stranding with peripancreatic free fluid. The pancreas enhances uniformly. Spleen: Unremarkable. Adrenals/Urinary Tract: --Adrenal glands: Unremarkable. --Right kidney/ureter: No hydronephrosis or radiopaque kidney stones. --Left kidney/ureter: No hydronephrosis or radiopaque kidney stones. --Urinary bladder: Unremarkable. Stomach/Bowel: --Stomach/Duodenum: No hiatal hernia or other gastric abnormality. Normal duodenal course and caliber. --Small bowel: Unremarkable. --Colon: Unremarkable. --Appendix: Normal. Vascular/Lymphatic: Normal course and caliber of the major abdominal vessels. The splenic vein remains patent. --No retroperitoneal lymphadenopathy. --No mesenteric lymphadenopathy. --No pelvic or inguinal lymphadenopathy. Reproductive: Unremarkable Other: There is a small amount of free fluid in the abdomen pelvis. There is a small fat containing umbilical hernia. Musculoskeletal. No acute displaced fractures. IMPRESSION: 1. Acute pancreatitis without evidence for pancreatic  necrosis. 2. Cholelithiasis without acute inflammation. 3. Small amount of free fluid in the abdomen and pelvis. Electronically Signed   By: Katherine Mantle M.D.   On: 01/16/2020 22:53   VAS [place] LOWER EXTREMITY VENOUS (DVT)  Result Date: 01/20/2020  Lower Venous DVT Study Indications: Swelling.  Comparison Study: No previous Exam Performing Technologist: Clint Guy RVT  Examination Guidelines: A complete evaluation includes B-mode imaging, spectral Doppler, color Doppler, and power Doppler as needed of all accessible portions of each vessel. Bilateral testing is considered an integral part of a  complete examination. Limited examinations for reoccurring indications may be performed as noted. The reflux portion of the exam is performed with the patient in reverse Trendelenburg.  +---------+---------------+---------+-----------+----------+--------------+ RIGHT    CompressibilityPhasicitySpontaneityPropertiesThrombus Aging +---------+---------------+---------+-----------+----------+--------------+ CFV      Full           Yes      Yes                                 +---------+---------------+---------+-----------+----------+--------------+ SFJ      Full                                                        +---------+---------------+---------+-----------+----------+--------------+ FV Prox  Full                                                        +---------+---------------+---------+-----------+----------+--------------+ FV Mid   Full                                                        +---------+---------------+---------+-----------+----------+--------------+ FV DistalFull                                                        +---------+---------------+---------+-----------+----------+--------------+ PFV      Full                                                        +---------+---------------+---------+-----------+----------+--------------+ POP      Full           Yes      Yes                                 +---------+---------------+---------+-----------+----------+--------------+ PTV      Full                                                        +---------+---------------+---------+-----------+----------+--------------+  PERO     Full                                                        +---------+---------------+---------+-----------+----------+--------------+   +---------+---------------+---------+-----------+----------+--------------+ LEFT     CompressibilityPhasicitySpontaneityPropertiesThrombus Aging  +---------+---------------+---------+-----------+----------+--------------+ CFV      Full           Yes      Yes                                 +---------+---------------+---------+-----------+----------+--------------+ SFJ      Full                                                        +---------+---------------+---------+-----------+----------+--------------+ FV Prox  Full                                                        +---------+---------------+---------+-----------+----------+--------------+ FV Mid   Full                                                        +---------+---------------+---------+-----------+----------+--------------+ FV DistalFull                                                        +---------+---------------+---------+-----------+----------+--------------+ PFV      Full                                                        +---------+---------------+---------+-----------+----------+--------------+ POP      Full           Yes      Yes                                 +---------+---------------+---------+-----------+----------+--------------+ PTV      Full                                                        +---------+---------------+---------+-----------+----------+--------------+ PERO     Full                                                        +---------+---------------+---------+-----------+----------+--------------+  Summary: RIGHT: - There is no evidence of deep vein thrombosis in the lower extremity.  - No cystic structure found in the popliteal fossa.  LEFT: - There is no evidence of deep vein thrombosis in the lower extremity.  - No cystic structure found in the popliteal fossa.  *See table(s) above for measurements and observations. Electronically signed by Sherald Hess MD on 01/20/2020 at 5:00:33 PM.    Final     DISCHARGE EXAMINATION: Vitals:   01/22/20 1713 01/22/20 2258 01/23/20 0348  01/23/20 1128  BP: 133/82 132/74 110/73 110/67  Pulse: 80 82 75 69  Resp: 18 16 18 16   Temp: 98.7 F (37.1 C) 98.4 F (36.9 C) 98.7 F (37.1 C) 98.2 F (36.8 C)  TempSrc: Oral Oral Oral Oral  SpO2: 96% 97% 97% 97%  Weight:   74.9 kg    General appearance: Awake alert.  In no distress Resp: Clear to auscultation bilaterally.  Normal effort Cardio: S1-S2 is normal regular.  No S3-S4.  No rubs murmurs or bruit    DISPOSITION: Home  Discharge Instructions    Call MD for:  difficulty breathing, headache or visual disturbances   Complete by: As directed    Call MD for:  extreme fatigue   Complete by: As directed    Call MD for:  persistant dizziness or light-headedness   Complete by: As directed    Call MD for:  persistant nausea and vomiting   Complete by: As directed    Call MD for:  severe uncontrolled pain   Complete by: As directed    Call MD for:  temperature >100.4   Complete by: As directed    Diet - low sodium heart healthy   Complete by: As directed    Discharge instructions   Complete by: As directed    Please follow instruction provided by general surgery.  You were cared for by a hospitalist during your hospital stay. If you have any questions about your discharge medications or the care you received while you were in the hospital after you are discharged, you can call the unit and asked to speak with the hospitalist on call if the hospitalist that took care of you is not available. Once you are discharged, your primary care physician will handle any further medical issues. Please note that NO REFILLS for any discharge medications will be authorized once you are discharged, as it is imperative that you return to your primary care physician (or establish a relationship with a primary care physician if you do not have one) for your aftercare needs so that they can reassess your need for medications and monitor your lab values. If you do not have a primary care physician,  you can call [phone] for a physician referral.   Increase activity slowly   Complete by: As directed    Increase activity slowly   Complete by: As directed    No wound care   Complete by: As directed          Allergies as of 01/23/2020   No Known Allergies     Medication List    TAKE these medications   ferrous sulfate 325 (65 FE) MG tablet Commonly known as: FerrouSul Take 1 tablet (325 mg total) by mouth 2 (two) times daily with a meal.   ibuprofen 400 MG tablet Commonly known as: ADVIL Take 1 tablet (400 mg total) by mouth every 8 (eight) hours as needed for fever or mild pain.   oxyCODONE  5 MG immediate release tablet Commonly known as: Oxy IR/ROXICODONE Take 1 tablet (5 mg total) by mouth every 4 (four) hours as needed for up to 5 days for severe pain.         Follow-up Information    Surgery, [facility]. Call in 1 day(s).   Specialty: General Surgery Why: To confirm your appointment time  Contact information: [address] [place] [address] [phone]               TOTAL DISCHARGE TIME: 35 minutes  Shamela Haydon  [facility] Pager on [email or web address]  01/24/2020, 11:18 AM

## 2020-01-24 LAB — CULTURE, BLOOD (ROUTINE X 2)
Culture: NO GROWTH
Culture: NO GROWTH
Special Requests: ADEQUATE
Special Requests: ADEQUATE

## 2020-01-25 LAB — SURGICAL PATHOLOGY

## 2020-02-01 ENCOUNTER — Encounter: Payer: Self-pay | Admitting: Internal Medicine

## 2020-02-01 ENCOUNTER — Other Ambulatory Visit: Payer: Self-pay

## 2020-02-01 ENCOUNTER — Ambulatory Visit: Payer: Medicaid Other | Attending: Internal Medicine | Admitting: Internal Medicine

## 2020-02-01 VITALS — BP 115/79 | HR 73 | Temp 98.3°F | Resp 16 | Ht 62.0 in | Wt 162.2 lb

## 2020-02-01 DIAGNOSIS — E538 Deficiency of other specified B group vitamins: Secondary | ICD-10-CM | POA: Diagnosis not present

## 2020-02-01 DIAGNOSIS — E611 Iron deficiency: Secondary | ICD-10-CM | POA: Insufficient documentation

## 2020-02-01 DIAGNOSIS — Z7689 Persons encountering health services in other specified circumstances: Secondary | ICD-10-CM | POA: Diagnosis not present

## 2020-02-01 DIAGNOSIS — Z23 Encounter for immunization: Secondary | ICD-10-CM

## 2020-02-01 DIAGNOSIS — E663 Overweight: Secondary | ICD-10-CM | POA: Diagnosis not present

## 2020-02-01 DIAGNOSIS — R7303 Prediabetes: Secondary | ICD-10-CM | POA: Diagnosis not present

## 2020-02-01 DIAGNOSIS — J302 Other seasonal allergic rhinitis: Secondary | ICD-10-CM | POA: Insufficient documentation

## 2020-02-01 LAB — POCT GLYCOSYLATED HEMOGLOBIN (HGB A1C): HbA1c, POC (prediabetic range): 5.9 % (ref 5.7–6.4)

## 2020-02-01 LAB — GLUCOSE, POCT (MANUAL RESULT ENTRY): POC Glucose: 110 mg/dl — AB (ref 70–99)

## 2020-02-01 MED ORDER — VITAMIN B-12 1000 MCG PO TABS: 1000.0000 ug | ORAL_TABLET | Freq: Every day | ORAL | 1 refills | Status: AC

## 2020-02-01 NOTE — Patient Instructions (Addendum)
Influenza Virus Vaccine injection (Fluarix) Qu es este medicamento? La VACUNA ANTIGRIPAL ayuda a disminuir el riesgo de contraer la influenza, tambin conocida como la gripe. La vacuna solo ayuda a protegerle contra algunas cepas de influenza. Esta vacuna no ayuda a reducir Catering manager de contraer influenza pandmica H1N1. Este medicamento puede ser utilizado para otros usos; si tiene alguna pregunta consulte con su proveedor de atencin mdica o con su farmacutico. MARCAS COMUNES: Fluarix, Fluzone Qu le debo informar a mi profesional de la salud antes de tomar este medicamento? Necesita saber si usted presenta alguno de los siguientes problemas o situaciones:  trastorno de sangrado como hemofilia  fiebre o infeccin  sndrome de Guillain-Barre u otros problemas neurolgicos  problemas del sistema inmunolgico  infeccin por el virus de la inmunodeficiencia humana (VIH) o SIDA  niveles bajos de plaquetas en la sangre  esclerosis mltiple  una reaccin IT consultant o inusual a las vacunas antigripales, a los huevos, protenas de pollo, al ltex, a la gentamicina, a otros medicamentos, alimentos, colorantes o conservantes  si est embarazada o buscando quedar embarazada  si est amamantando a un beb Cmo debo [employer]? Esta vacuna se administra mediante inyeccin por va intramuscular. Lo administra un profesional de [employer]	[place]. Recibir una copia de informacin escrita sobre la vacuna antes de cada vacuna. Asegrese de leer este folleto cada vez cuidadosamente. Este folleto puede cambiar con frecuencia. Hable con su pediatra para informarse acerca del uso de este medicamento en nios. Puede requerir atencin especial. Sobredosis: Pngase en contacto inmediatamente con un centro toxicolgico o una sala de urgencia si usted cree que haya tomado demasiado medicamento. ATENCIN: [employer] es solo para usted. No comparta este medicamento con nadie. Qu sucede si me  olvido de una dosis? No se aplica en este caso. Qu puede interactuar con este medicamento?  quimioterapia o radioterapia  medicamentos que suprimen el sistema inmunolgico, tales como etanercept, anakinra, infliximab y adalimumab  medicamentos que tratan o previenen cogulos sanguneos, como warfarina  fenitona  medicamentos esteroideos, como la prednisona o la cortisona  teofilina  vacunas Puede ser que esta lista no menciona todas las posibles interacciones. Informe a su profesional de [employer]	[place] de [employer] productos a base de hierbas, medicamentos de Benjamin Perez o suplementos nutritivos que est tomando. Si usted fuma, consume bebidas alcohlicas o si utiliza drogas ilegales, indqueselo tambin a su profesional de [employer] Home	[place]. Algunas sustancias pueden interactuar con su medicamento. A qu debo estar atento al usar [employer]? Informe a su mdico o a Barrister's clerk de la [employer] todos los efectos secundarios que persistan despus de 3 das. Llame a su proveedor de atencin mdica si se presentan sntomas inusuales dentro de las 6 semanas posteriores a la vacunacin. Es posible que todava pueda contraer la gripe, pero la enfermedad no ser tan fuerte como normalmente. No puede contraer la gripe de esta vacuna. La vacuna antigripal no le protege contra resfros u otras enfermedades que pueden causar [place]. Debe vacunarse cada ao. Qu efectos secundarios puedo tener al [employer] este medicamento? Efectos secundarios que debe informar a su mdico o a Barrister's clerk de la salud tan pronto como sea posible:  Chief of Staff como erupcin cutnea, picazn o urticarias, hinchazn de la cara, labios o lengua Efectos secundarios que, por lo general, no requieren atencin mdica (debe informarlos a su mdico o a su profesional de la salud si persisten o si son molestos):  fiebre  dolor de cabeza  molestias y dolores musculares  dolor, sensibilidad, enrojecimiento o  hinchazn en el lugar de la inyeccin  cansancio o debilidad Puede ser que esta lista no menciona todos los posibles efectos secundarios. Comunquese a su mdico por asesoramiento mdico [employer]. Usted puede informar los efectos secundarios a la FDA por telfono al 1-800-FDA-1088. Dnde debo guardar mi medicina? Esta vacuna se administra solamente en clnicas, farmacias, consultorio mdico u otro consultorio de un profesional de la salud y no Teacher, early years/pre en su domicilio. ATENCIN: Este folleto es un resumen. Puede ser que no cubra toda la posible informacin. Si usted tiene preguntas acerca de esta medicina, consulte con su mdico, su farmacutico o su profesional de Radiographer, therapeutic.  2020 Elsevier/Gold Standard (2009-08-01 15:31:40)    Plan de alimentacin para personas con prediabetes Prediabetes Eating Plan La prediabetes es una afeccin que hace que los niveles de azcar en la sangre (glucosa) sean ms altos de lo normal. Esto aumenta el riesgo de tener diabetes. Para prevenir la diabetes, es posible que su mdico le recomiende cambios en la dieta y otros cambios en su estilo de vida que lo ayuden a Personnel officer lo siguiente: Chief Operating Officer los niveles de glucemia. Mejorar los niveles de [place]. Controlar la presin arterial. El mdico puede recomendarle que trabaje con un especialista en alimentacin y nutricin (nutricionista) para Materials engineer de comidas ms conveniente para usted. Consejos para seguir este plan: Estilo de vida Establezca metas para bajar de peso con la ayuda de su equipo de atencin mdica. A la [employer] con prediabetes se les recomienda bajar un 7% de su peso corporal. Haga ejercicio al menos 5das por semana, como mnimo. Asista a un grupo de apoyo o solicite el apoyo continuo de un consejero de salud mental. [employer] medicamentos de venta libre y los recetados solamente como se lo haya indicado el mdico. Leer las  etiquetas de los alimentos Lea las etiquetas de los alimentos envasados para controlar la cantidad de grasa, sal (sodio) y azcar que contienen. Evite los alimentos que contengan lo siguiente: Grasas saturadas. Grasas trans. Azcares agregados. Evite los alimentos que contengan ms de 328miligramos(mg) de sodio por porcin. Limite el consumo diario de sodio a menos de 2300mg  por . De compras Evite comprar alimentos procesados y preelaborados. Coccin Cocine con aceite de oliva. No use mantequilla, manteca de cerdo o Futures trader. Cocine los alimentos al horno, a la parrilla, asados o hervidos. Evite frerlos. Planificacin de las comidas  Trabaje con el nutricionista para crear un plan de alimentacin que sea adecuado para usted. Esto puede incluir lo siguiente: Registro de la cantidad de caloras que ingiere. Use un registro de alimentos, un cuaderno o una aplicacin mvil para anotar lo que comi en cada comida. Uso del ndice glucmico (IG) para planificar las comidas. El ndice [employer] con qu rapidez elevar la glucemia un alimento. Elija alimentos con bajo IG. Estos demoran ms en elevar la glucemia. Considere la posibilidad de seguir [place]. Esta dieta incluye lo siguiente: Varias porciones de frutas y verduras frescas por Clinical cytogeneticist. Pescado al Futures trader veces por semana. Varias porciones de cereales integrales, frijoles, frutos secos y semillas por da. Aceite de [employer] de otras grasas. Consumo moderado de alcohol. Pequeas cantidades de carnes rojas y lcteos enteros. Si tiene hipertensin arterial, quizs Location manager consumo de sodio o seguir una dieta como el plan de alimentacin basado en Enfoques Alimentarios para Detener la Hipertensin (Dietary Approaches to Stop Hypertension, DASH). Este es un  plan de alimentacin cuyo objetivo es bajar la hipertensin arterial. Qu alimentos se recomiendan? Es posible que los alimentos incluidos a  continuacin no Engineer, drilling. Hable con el nutricionista sobre las mejores opciones alimenticias para usted. Cereales Productos integrales, como panes, galletas, cereales y pastas de salvado o integrales. Avena sin azcar. Trigo burgol. Cebada. Quinua. Arroz integral. Tacos o tortillas de harina de maz o de salvado. Hoover Brunette Deatra James. Espinaca. Guisantes. Remolachas. Coliflor. Repollo. Brcoli. Zanahorias. Tomates. [place]. Christella Noa. Hierbas. Pimienta. Cebollas. Pepinos. Repollitos de Bruselas. Frutas Frutos rojos. Bananas. Manzanas. Naranjas. Uvas. Papaya. Mango. Gran[place]. Kiwi. Pomelo. Cerezas. Carnes y otros alimentos ricos en protenas Mariscos. Carne de ave sin piel. Cortes magros de cerdo y carne de res. Tofu. Huevos. Frutos secos. Frijoles. Lcteos Productos lcteos descremados o semidescremados, como yogur, queso cottage y [place]. [employer]. T. Caf. Gaseosas sin azcar o dietticas. Soda. Leche descremada o semidescremada. Productos alternativos a la [place], como Oak de soja o de [place]. Grasas y aceites Aceite de [place]. Aceite de canola. Aceite de girasol. Aceite de semillas de uva. Aguacate. Nueces. Dulces y postres Pudin sin azcar o con bajo contenido de [place]. Helado y otros postres congelados sin azcar o con bajo contenido de [place]. Condimentos y otros alimentos Hierbas. Especias sin sodio. Mostaza. Salsa de pepinillos. [place] con bajo contenido de [place] y de International aid/development worker. Salsa barbacoa con bajo contenido de grasa y de azcar. Mayonesa con bajo contenido de grasa o sin grasa. Qu alimentos no se recomiendan? Es posible que los alimentos incluidos a continuacin no Engineer, drilling. Hable con el nutricionista sobre las mejores opciones alimenticias para usted. Cereales Productos elaborados con [place] y [place], como panes, pastas, bocadillos y cereales. Verduras Verduras enlatadas. Verduras congeladas con mantequilla o salsa de  crema. Nils Pyle Frutas enlatadas al almbar. Carnes y otros alimentos ricos en protenas Cortes de carne con grasa. Carne de ave con piel. Carne empanizada o frita. Carnes procesadas. Lcteos Yogur, queso o [employer] Health. Bebidas Bebidas azucaradas, como t helado dulce y [place]. Grasas y [employer]. Manteca de cerdo. Mantequilla clarificada. Dulces y [employer], como pasteles, pastelitos, galletas dulces y tarta de [place]. Condimentos y otros alimentos Mezclas de especias con sal agregada. Ktchup. Salsa barbacoa. Mayonesa. Resumen Para prevenir la diabetes, es posible que Personnel officer en la dieta y otros cambios en su estilo de vida para ayudar a Pharmacologist en la sangre, mejorar los niveles de colesterol y Scientist, physiological presin arterial. Establezca metas para bajar de peso con la ayuda de su equipo de atencin mdica. A la [employer] con prediabetes se les recomienda bajar un 7por ciento de su peso corporal. Considere la posibilidad de seguir una dieta mediterrnea que incluya muchas frutas y verduras frescas, cereales integrales, frijoles, frutos secos, semillas, pescado, carnes magras, lcteos descremados y aceites saludables. Esta informacin no tiene Theme park manager el consejo del mdico. Asegrese de hacerle al mdico cualquier pregunta que tenga. Document Revised: 08/12/2016 Document Reviewed: 08/12/2016 Elsevier Patient Education  2020 [employer].

## 2020-02-01 NOTE — Progress Notes (Signed)
Patient ID: Kristy Phillips, female    DOB: 06/21/1980  MRN: [id number]  CC: new pt visit  Subjective: Kristy Phillips is a 39 y.o. female who presents for new patient visit and hospital follow-up. Her concerns today include:  Patient with history of iron deficiency anemia, vitamin B12 deficiency, recent gallstone pancreatitis status post laparoscopic cholecystectomy  Pt hosp earlier this mth with gall stone pancreatitis requiring lap choley. Has appt with surgeon 02/15/2019. Doing well post-surgery. Little pain when she coughs Eating well and moving bowel okay.   C/o having a dry cough x 3 days.  +scratchy itchy sensation at back of throat.  Little sneezing.  No drainage of mucous at back of throat.   Dx with iron def during hosp. Dischg home on iron supplement.  Bottle says once daily dosing but dischg paper says BID. On review of chart, pt has had a mild anemia dating back to 2010.  Regular menses lasting 7 days.  Heavy 2-3rd days.  Not a lot of cramps.  Also found to have B12 def but pt was not aware of this. She is breast feeding a 81 mth old infant.   Past medical, surgical, family history and social history reviewed.   Patient Active Problem List   Diagnosis Date Noted  . Iron deficiency anemia due to chronic blood loss   . Epigastric pain 01/17/2020  . Nausea & vomiting 01/17/2020  . Acute pancreatitis 01/16/2020  . Labor and delivery, indication for care 03/07/2019  . AMA (advanced maternal age) multigravida 35+ 03/07/2019     Current Outpatient Medications on File Prior to Visit  Medication Sig Dispense Refill  . ferrous sulfate (FERROUSUL) 325 (65 FE) MG tablet Take 1 tablet (325 mg total) by mouth 2 (two) times daily with a meal. 60 tablet 3  . ibuprofen (ADVIL) 400 MG tablet Take 1 tablet (400 mg total) by mouth every 8 (eight) hours as needed for fever or mild pain. 30 tablet 0   No current facility-administered medications on file prior to visit.    No Known  Allergies  Social History   Socioeconomic History  . Marital status: Single    Spouse name: Not on file  . Number of children: Not on file  . Years of education: Not on file  . Highest education level: Not on file  Occupational History  . Not on file  Tobacco Use  . Smoking status: Never Smoker  . Smokeless tobacco: Never Used  Vaping Use  . Vaping Use: Never used  Substance and Sexual Activity  . Alcohol use: Not Currently  . Drug use: Never  . Sexual activity: Yes  Other Topics Concern  . Not on file  Social History Narrative  . Not on file   Social Determinants of Health   Financial Resource Strain: Not on file  Food Insecurity: Not on file  Transportation Needs: Not on file  Physical Activity: Not on file  Stress: Not on file  Social Connections: Not on file  Intimate Partner Violence: Not on file    Family History  Problem Relation Age of Onset  . Diabetes Mother   . Diabetes Maternal Aunt   . Diabetes Maternal Uncle   . Diabetes Maternal Grandmother     Past Surgical History:  Procedure Laterality Date  . CHOLECYSTECTOMY N/A 01/22/2020   Procedure: LAPAROSCOPIC CHOLECYSTECTOMY WITH INTRAOPERATIVE CHOLANGIOGRAM;  Surgeon: Quentin Ore, MD;  Location: [facility] OR;  Service: General;  Laterality: N/A;  . NO PAST  SURGERIES      ROS: Review of Systems Negative except as stated above  PHYSICAL EXAM: BP 115/79   Pulse 73   Temp 98.3 F (36.8 C)   Resp 16   Ht 5\' 2"  (1.575 m)   Wt 162 lb 3.2 oz (73.6 kg)   LMP 01/06/2020 Comment: CURRENT MENSES  SpO2 97%   BMI 29.67 kg/m   Wt Readings from Last 3 Encounters:  02/01/20 162 lb 3.2 oz (73.6 kg)  01/23/20 165 lb 0.9 oz (74.9 kg)  03/07/19 182 lb (82.6 kg)    Physical Exam  General appearance - alert, well appearing, young to middle-aged Hispanic female and in no distress Mental status - normal mood, behavior, speech, dress, motor activity, and thought processes Neck - supple, no significant  adenopathy Chest - clear to auscultation, no wheezes, rales or rhonchi, symmetric air entry Heart - normal rate, regular rhythm, normal S1, S2, no murmurs, rubs, clicks or gallops Abdomen -I note 3-4 incisions from laparoscopic surgery.  They appear to be healing well with no surrounding erythema or concern for dehiscence.  Soft, nontender, nondistended, no masses or organomegaly Extremities - peripheral pulses normal, no pedal edema, no clubbing or cyanosis  Results for orders placed or performed in visit on 02/01/20  POCT glucose (manual entry)  Result Value Ref Range   POC Glucose 110 (A) 70 - 99 mg/dl  POCT glycosylated hemoglobin (Hb A1C)  Result Value Ref Range   Hemoglobin A1C     HbA1c POC (<> result, manual entry)     HbA1c, POC (prediabetic range) 5.9 5.7 - 6.4 %   HbA1c, POC (controlled diabetic range)      CMP Latest Ref Rng & Units 01/23/2020 01/22/2020 01/21/2020  Glucose 70 - 99 mg/dL 14/11/2019) 937(J) 696(V)  BUN 6 - 20 mg/dL 7 7 5(L)  Creatinine 893(Y - 1.00 mg/dL 1.01 7.51 0.25  Sodium 135 - 145 mmol/L 138 138 138  Potassium 3.5 - 5.1 mmol/L 3.9 3.7 3.6  Chloride 98 - 111 mmol/L 104 102 102  CO2 22 - 32 mmol/L 24 25 26   Calcium 8.9 - 10.3 mg/dL 9.1 8.52) )  Total Protein 6.5 - 8.1 g/dL 6.9 6.9 6.6  Total Bilirubin 0.3 - 1.2 mg/dL 0.5 0.6 0.7  Alkaline Phos 38 - 126 U/L 92 89 90  AST 15 - 41 U/L 45(H) 11(L) 11(L)  ALT 0 - 44 U/L 32 19 20   Lipid Panel     Component Value Date/Time   TRIG 65 01/17/2020 0723    CBC    Component Value Date/Time   WBC 7.9 01/23/2020 0251   RBC 4.06 01/23/2020 0251   HGB 9.9 (L) 01/23/2020 0251   HCT 31.8 (L) 01/23/2020 0251   PLT 383 01/23/2020 0251   MCV 78.3 (L) 01/23/2020 0251   MCH 24.4 (L) 01/23/2020 0251   MCHC 31.1 01/23/2020 0251   RDW 14.4 01/23/2020 0251   LYMPHSABS 1.7 11/21/2008 0935   MONOABS 0.5 11/21/2008 0935   EOSABS 0.0 11/21/2008 0935   BASOSABS 0.0 11/21/2008 0935    ASSESSMENT AND PLAN: 1.  Encounter to establish care  2. Iron deficiency Most likely due to menstrual cycles. Patient will continue iron supplement taking it at least once daily.  Follow-up at the lab in 1 month for repeat level check. - Iron, TIBC and Ferritin Panel; Future  3. Vitamin B12 deficiency Recommend taking vitamin B12 supplement daily to help correct current deficiency.  Follow-up at lab in  1 month for repeat B12 level check - vitamin B-12 (CYANOCOBALAMIN) 1000 MCG tablet; Take 1 tablet (1,000 mcg total) by mouth daily.  Dispense: 60 tablet; Refill: 1 - Vitamin B12; Future  4. Prediabetes Discussed diagnosis of prediabetes and printed information given. Discussed the importance of healthy eating habits and regular exercise to help prevent progression to full diabetes.  Encouraged her to get in at least about 150 minutes total per week of moderate intensity exercise.  5. Over weight See #4 above - POCT glucose (manual entry) - POCT glycosylated hemoglobin (Hb A1C)  6. Need for immunization against influenza - Flu Vaccine QUAD 36+ mos IM  7. Seasonal allergic rhinitis, unspecified trigger Recommend Claritin over-the-counter   Patient was given the opportunity to ask questions.  Patient verbalized understanding of the plan and was able to repeat key elements of the plan.  AMN video interpreter used during this encounter. Rose #[id number]  Orders Placed This Encounter  Procedures  . Flu Vaccine QUAD 36+ mos IM  . POCT glucose (manual entry)  . POCT glycosylated hemoglobin (Hb A1C)     Requested Prescriptions    No prescriptions requested or ordered in this encounter    No follow-ups on file.  Jonah Blue, MD, FACP

## 2020-02-08 ENCOUNTER — Telehealth: Payer: Self-pay | Admitting: Internal Medicine

## 2020-02-08 NOTE — Telephone Encounter (Signed)
Copied from CRM [id number]. Topic: General - Other >> Feb 07, 2020 11:25 AM Kristy Phillips wrote: Reason for CRM: Patient asking Dr Laural Benes to please help her to find [facility] dentist ASAP please. Please call Ph# [phone]  Patient has upcoming OC appt. Unsure if patient needs to wait until receiving OC for dental referral or not.

## 2020-02-09 NOTE — Telephone Encounter (Signed)
Please reach out to pt

## 2020-02-10 NOTE — Telephone Encounter (Signed)
Lvm to patient . Patient need oc  and referral from pcp

## 2020-02-15 ENCOUNTER — Ambulatory Visit: Payer: Self-pay | Attending: Internal Medicine

## 2020-02-15 ENCOUNTER — Other Ambulatory Visit: Payer: Self-pay

## 2020-02-17 ENCOUNTER — Ambulatory Visit: Payer: Self-pay | Attending: Internal Medicine

## 2020-02-17 ENCOUNTER — Other Ambulatory Visit: Payer: Self-pay

## 2020-03-31 ENCOUNTER — Telehealth: Payer: Self-pay | Admitting: Internal Medicine

## 2020-03-31 NOTE — Telephone Encounter (Signed)
Copied from CRM [id number]. Topic: General - Call Back - No Documentation >> Mar 31, 2020 10:55 AM Randol Kern wrote: Reason for CRM: Pt wants a call back from [facility] to discuss financial assistance   Best contact: [phone]

## 2020-04-03 NOTE — Telephone Encounter (Signed)
I return Pt call Pt was inform that will mail the letter to her

## 2020-04-07 ENCOUNTER — Telehealth: Payer: Self-pay | Admitting: Internal Medicine

## 2020-04-07 NOTE — Telephone Encounter (Signed)
I return Pt call, Pt was inform that her [employer] letter will exp 05/18/20 she need to call after exp to schedule a financial appt

## 2020-04-07 NOTE — Telephone Encounter (Signed)
Copied from CRM:   Pt stated she received letter and she needs to speak with Mikle Bosworth. Pt requests Mikle Bosworth return her call.   Please follow up.

## 2021-08-29 IMAGING — CT CT ABD-PELV W/ CM
2 of 4 series · 16 of 46 positions shown, 18 images · IV contrast (omnipaque)
Comparison: None.

CLINICAL DATA: Pancreatitis.  Nausea and vomiting.

EXAM:
CT ABDOMEN AND PELVIS WITH CONTRAST
TECHNIQUE: Multidetector CT imaging of the abdomen and pelvis was performed
using the standard protocol following bolus administration of
intravenous contrast.
CONTRAST:  100mL OMNIPAQUE IOHEXOL 300 MG/ML  SOLN

[Series 3: abdomen 5.0 · axial · 0.86mm/px · z∈[-446,+29]mm · 13 of 107 slices shown, 15 images]
[im 6/107  soft-tissue]
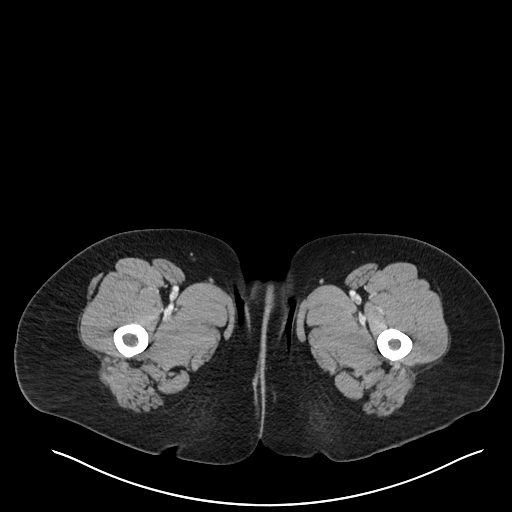
[im 6/107  bone]
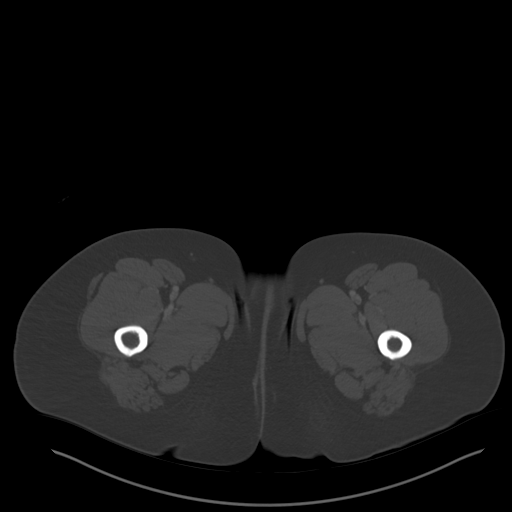
[im 16/107  soft-tissue]
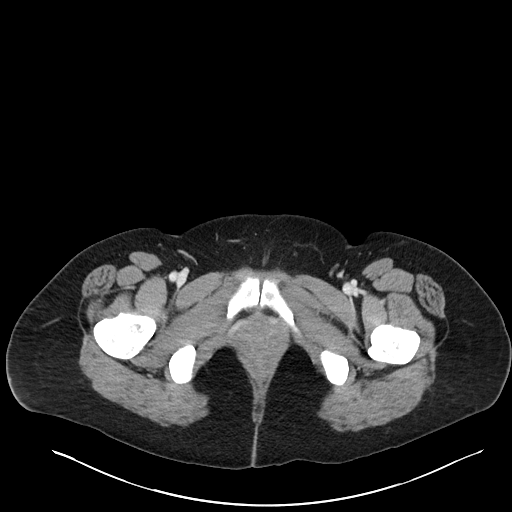
[im 22/107  soft-tissue]
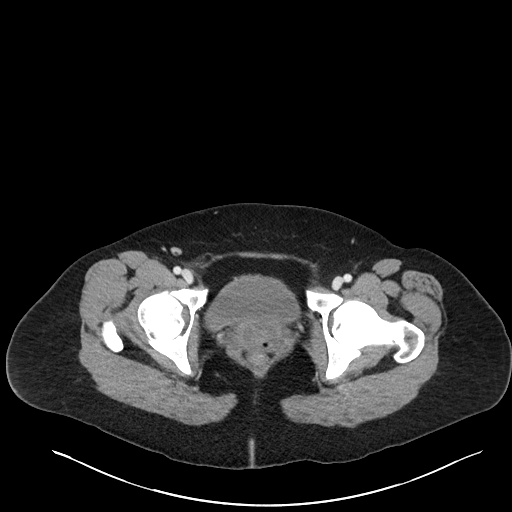
[im 32/107  soft-tissue]
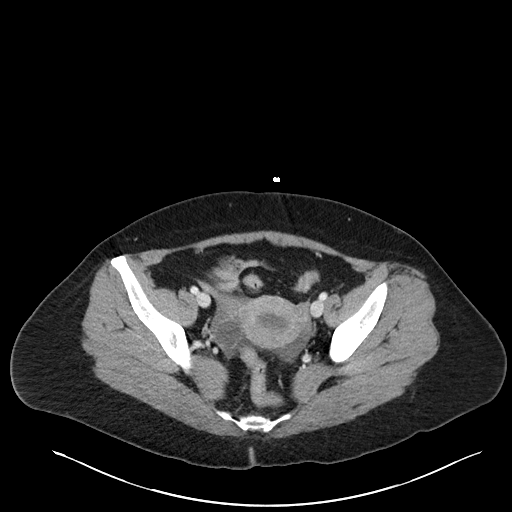
[im 38/107  soft-tissue]
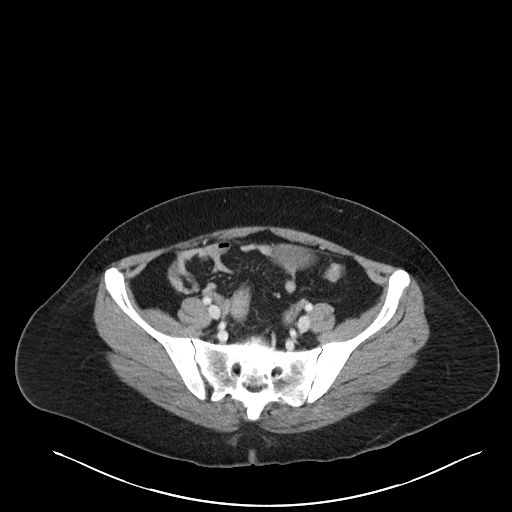
[im 48/107  soft-tissue]
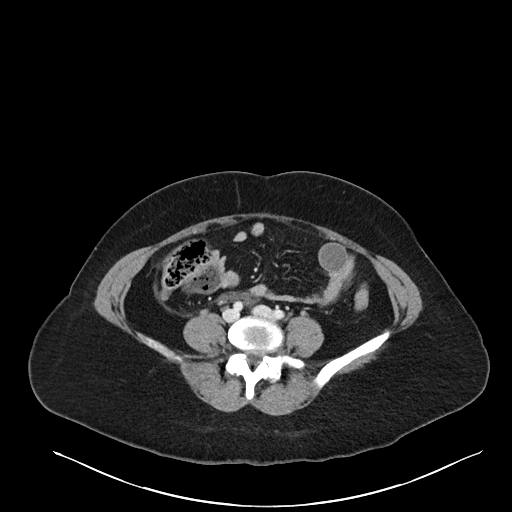
[im 54/107  soft-tissue]
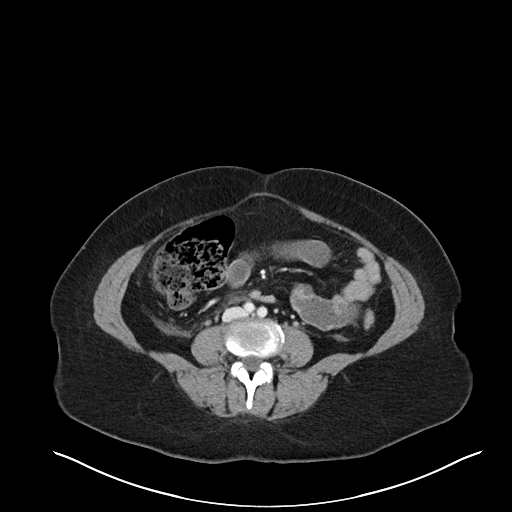
[im 59/107  soft-tissue]
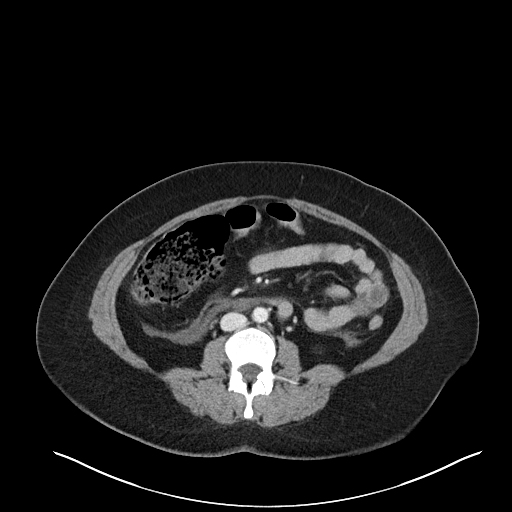
[im 69/107  soft-tissue]
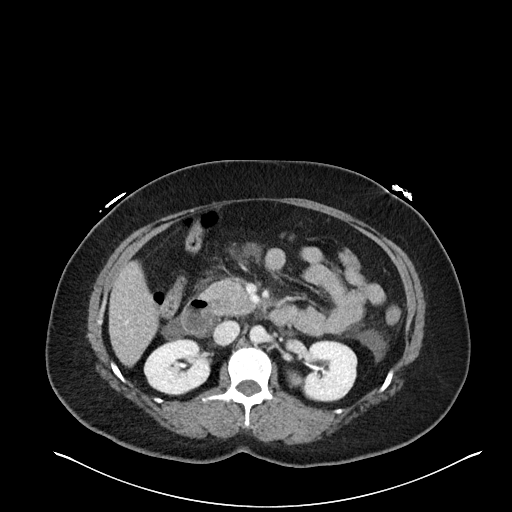
[im 69/107  bone]
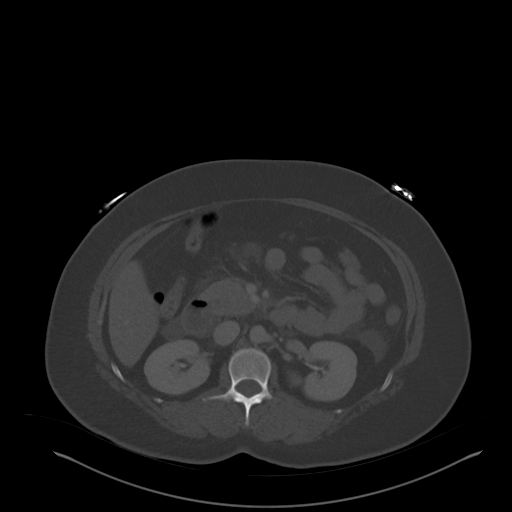
[im 75/107  soft-tissue]
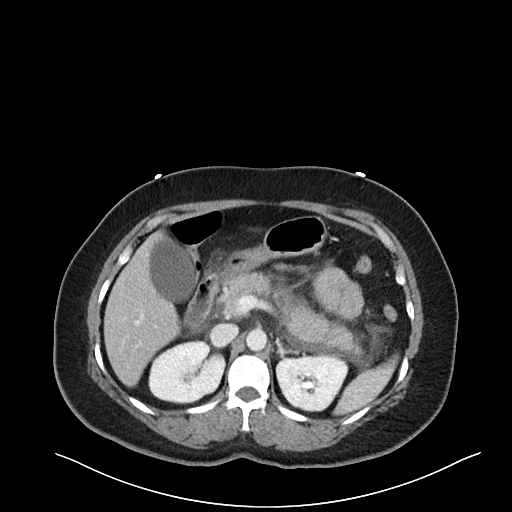
[im 85/107  soft-tissue]
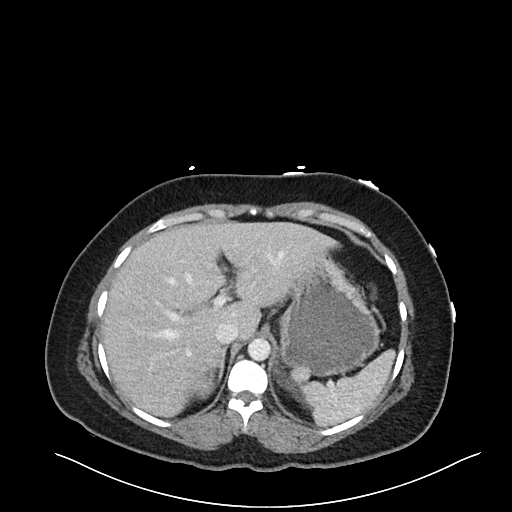
[im 91/107  soft-tissue]
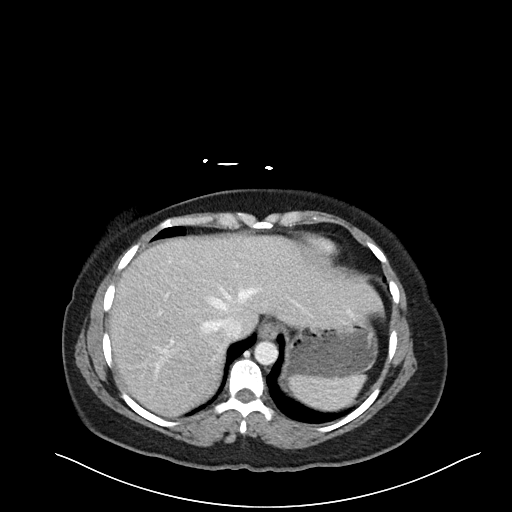
[im 101/107  soft-tissue]
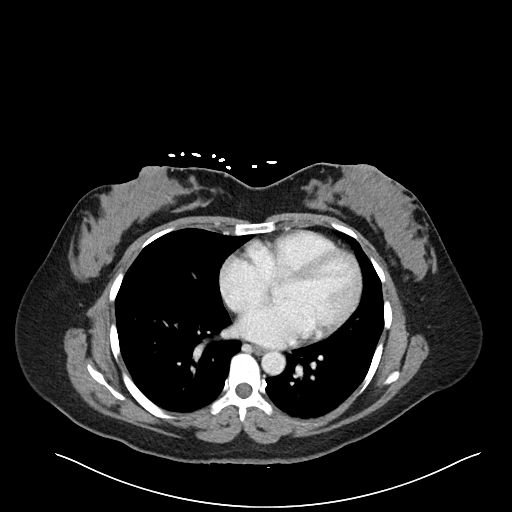

[Series 6: abdomen 3.0 mpr cor · coronal · 0.85mm/px · 3 of 88 slices shown]
[im 30/88  soft-tissue]
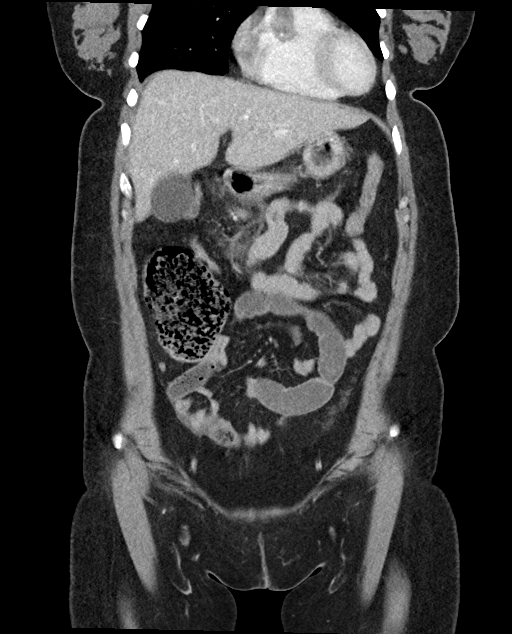
[im 39/88  soft-tissue]
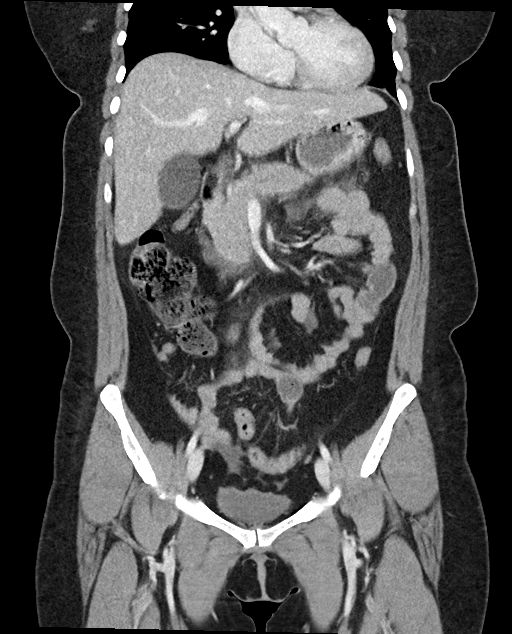
[im 49/88  soft-tissue]
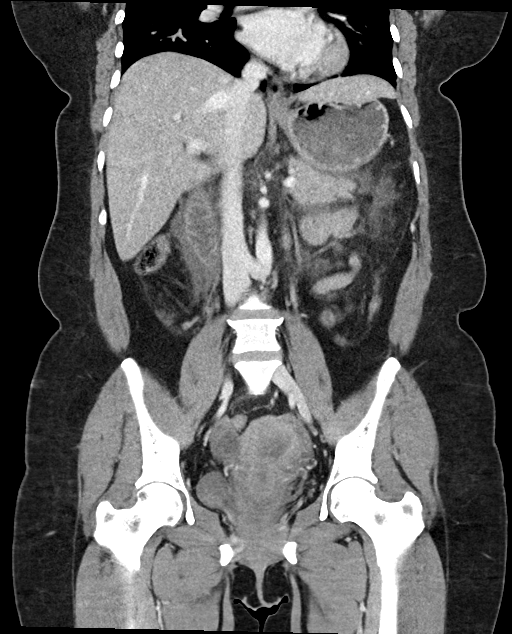

[16 of 46 positions shown; findings below may reference images not displayed]

FINDINGS: Lower chest: The lung bases are clear. The heart size is normal.

Hepatobiliary: The liver is normal. Cholelithiasis without acute
inflammation.There is no biliary ductal dilation.

Pancreas: There is peripancreatic fat stranding with peripancreatic
free fluid. The pancreas enhances uniformly.

Spleen: Unremarkable.

Adrenals/Urinary Tract:

--Adrenal glands: Unremarkable.

--Right kidney/ureter: No hydronephrosis or radiopaque kidney
stones.

--Left kidney/ureter: No hydronephrosis or radiopaque kidney stones.

--Urinary bladder: Unremarkable.

Stomach/Bowel:

--Stomach/Duodenum: No hiatal hernia or other gastric abnormality.
Normal duodenal course and caliber.

--Small bowel: Unremarkable.

--Colon: Unremarkable.

--Appendix: Normal.

Vascular/Lymphatic: Normal course and caliber of the major abdominal
vessels. The splenic vein remains patent.

--No retroperitoneal lymphadenopathy.

--No mesenteric lymphadenopathy.

--No pelvic or inguinal lymphadenopathy.

Reproductive: Unremarkable

Other: There is a small amount of free fluid in the abdomen pelvis.
There is a small fat containing umbilical hernia.

Musculoskeletal. No acute displaced fractures.
IMPRESSION: 1. Acute pancreatitis without evidence for pancreatic necrosis.
2. Cholelithiasis without acute inflammation.
3. Small amount of free fluid in the abdomen and pelvis.

## 2021-09-04 IMAGING — RF DG CHOLANGIOGRAM OPERATIVE
1 series · 1 of 1 positions shown · non-contrast
Comparison: CT of 01/16/2020

CLINICAL DATA: Cholelithiasis

EXAM:
INTRAOPERATIVE CHOLANGIOGRAM
TECHNIQUE: Cholangiographic image from the C-arm fluoroscopic device submitted
for interpretation post-operatively. Please see the procedural
report for the amount of contrast and the fluoroscopy time utilized.

[Series 1: run · 1 of 1 slices shown]
[im 1/1]
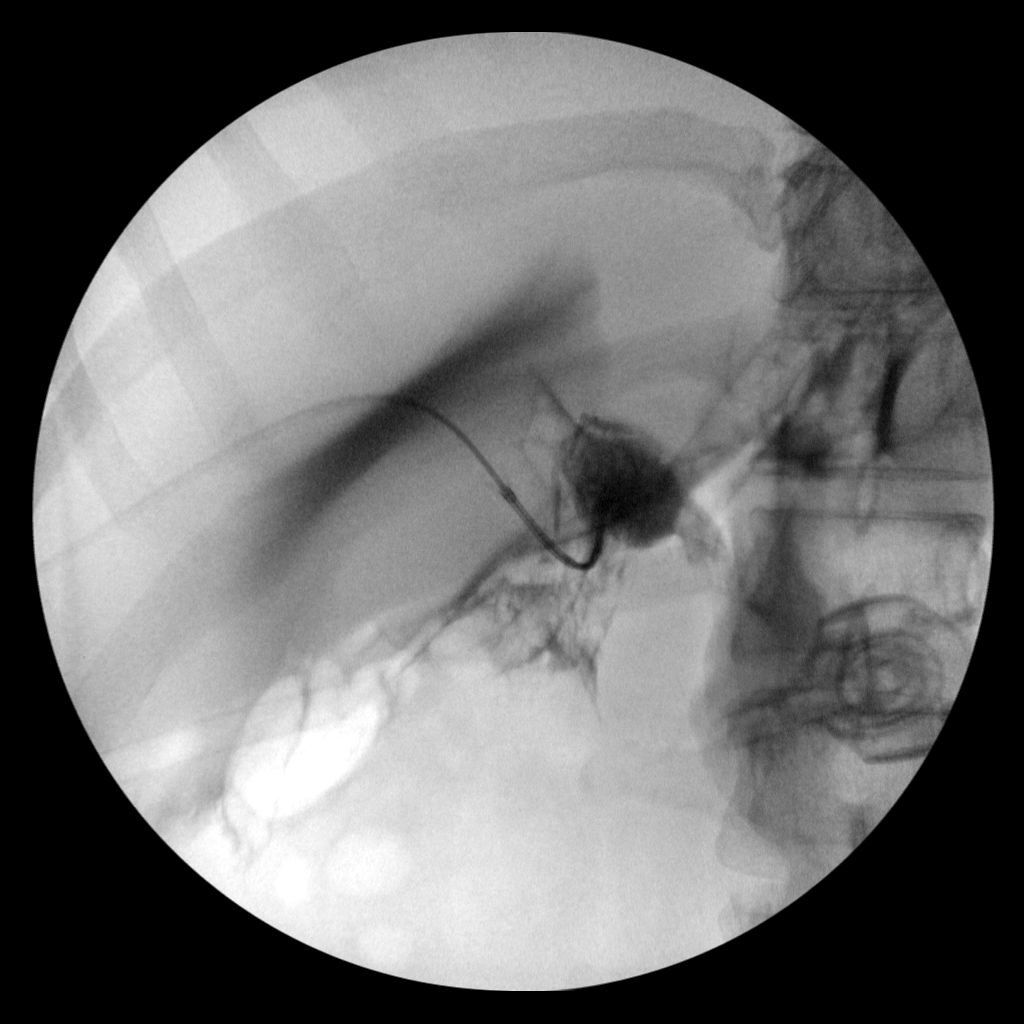

[1 of 1 positions shown; findings below may reference images not displayed]

FINDINGS: Single image demonstrates subhepatic extravasation of contrast in
the region of the gallbladder fossa. No visualization of the biliary
tree or CBD.
IMPRESSION: Limited study.  Choledocholithiasis not evaluated.
